# Patient Record
Sex: Female | Born: 1935 | Race: Black or African American | Hispanic: No | Smoking: Never smoker
Health system: Southern US, Community
[De-identification: ages and names within clinical notes are randomized; demographics above are authoritative.]

## PROBLEM LIST (undated history)

## (undated) DIAGNOSIS — G629 Polyneuropathy, unspecified: Secondary | ICD-10-CM

## (undated) DIAGNOSIS — M1990 Unspecified osteoarthritis, unspecified site: Secondary | ICD-10-CM

## (undated) DIAGNOSIS — E785 Hyperlipidemia, unspecified: Secondary | ICD-10-CM

## (undated) DIAGNOSIS — D649 Anemia, unspecified: Secondary | ICD-10-CM

## (undated) DIAGNOSIS — Z86711 Personal history of pulmonary embolism: Secondary | ICD-10-CM

## (undated) DIAGNOSIS — C8300 Small cell B-cell lymphoma, unspecified site: Secondary | ICD-10-CM

## (undated) DIAGNOSIS — F329 Major depressive disorder, single episode, unspecified: Secondary | ICD-10-CM

## (undated) DIAGNOSIS — F0390 Unspecified dementia without behavioral disturbance: Secondary | ICD-10-CM

## (undated) DIAGNOSIS — C801 Malignant (primary) neoplasm, unspecified: Secondary | ICD-10-CM

## (undated) HISTORY — PX: OTHER SURGICAL HISTORY: SHX169

## (undated) HISTORY — PX: COLON SURGERY: SHX602

## (undated) HISTORY — PX: ABDOMINAL HYSTERECTOMY: SHX81

## (undated) HISTORY — PX: APPENDECTOMY: SHX54

---

## 2014-12-20 DIAGNOSIS — Z933 Colostomy status: Secondary | ICD-10-CM

## 2014-12-20 DIAGNOSIS — C9110 Chronic lymphocytic leukemia of B-cell type not having achieved remission: Secondary | ICD-10-CM | POA: Diagnosis present

## 2019-04-02 DIAGNOSIS — R509 Fever, unspecified: Secondary | ICD-10-CM | POA: Diagnosis present

## 2019-04-20 DIAGNOSIS — Q6211 Congenital occlusion of ureteropelvic junction: Secondary | ICD-10-CM

## 2019-05-27 ENCOUNTER — Inpatient Hospital Stay (HOSPITAL_COMMUNITY)
Admission: EM | Admit: 2019-05-27 | Discharge: 2019-06-03 | DRG: 871 | Disposition: A | Discharge status: Hospice | Payer: Medicare Other | Source: Skilled Nursing Facility | Attending: Internal Medicine | Admitting: Internal Medicine

## 2019-05-27 ENCOUNTER — Emergency Department (HOSPITAL_COMMUNITY): Payer: Medicare Other

## 2019-05-27 ENCOUNTER — Encounter (HOSPITAL_COMMUNITY): Payer: Self-pay

## 2019-05-27 DIAGNOSIS — Z86711 Personal history of pulmonary embolism: Secondary | ICD-10-CM | POA: Diagnosis not present

## 2019-05-27 DIAGNOSIS — Z933 Colostomy status: Secondary | ICD-10-CM

## 2019-05-27 DIAGNOSIS — Z87891 Personal history of nicotine dependence: Secondary | ICD-10-CM | POA: Diagnosis not present

## 2019-05-27 DIAGNOSIS — C9110 Chronic lymphocytic leukemia of B-cell type not having achieved remission: Secondary | ICD-10-CM | POA: Diagnosis present

## 2019-05-27 DIAGNOSIS — Z881 Allergy status to other antibiotic agents status: Secondary | ICD-10-CM | POA: Diagnosis not present

## 2019-05-27 DIAGNOSIS — Q6211 Congenital occlusion of ureteropelvic junction: Secondary | ICD-10-CM | POA: Diagnosis not present

## 2019-05-27 DIAGNOSIS — R0602 Shortness of breath: Secondary | ICD-10-CM

## 2019-05-27 DIAGNOSIS — Z7189 Other specified counseling: Secondary | ICD-10-CM

## 2019-05-27 DIAGNOSIS — Z66 Do not resuscitate: Secondary | ICD-10-CM | POA: Diagnosis present

## 2019-05-27 DIAGNOSIS — D6959 Other secondary thrombocytopenia: Secondary | ICD-10-CM | POA: Diagnosis present

## 2019-05-27 DIAGNOSIS — E785 Hyperlipidemia, unspecified: Secondary | ICD-10-CM | POA: Diagnosis present

## 2019-05-27 DIAGNOSIS — F0390 Unspecified dementia without behavioral disturbance: Secondary | ICD-10-CM | POA: Diagnosis present

## 2019-05-27 DIAGNOSIS — N390 Urinary tract infection, site not specified: Secondary | ICD-10-CM | POA: Diagnosis present

## 2019-05-27 DIAGNOSIS — N136 Pyonephrosis: Secondary | ICD-10-CM | POA: Diagnosis present

## 2019-05-27 DIAGNOSIS — L8990 Pressure ulcer of unspecified site, unspecified stage: Secondary | ICD-10-CM | POA: Diagnosis present

## 2019-05-27 DIAGNOSIS — M7989 Other specified soft tissue disorders: Secondary | ICD-10-CM | POA: Diagnosis not present

## 2019-05-27 DIAGNOSIS — Z515 Encounter for palliative care: Secondary | ICD-10-CM

## 2019-05-27 DIAGNOSIS — Z681 Body mass index (BMI) 19 or less, adult: Secondary | ICD-10-CM | POA: Diagnosis not present

## 2019-05-27 DIAGNOSIS — D539 Nutritional anemia, unspecified: Secondary | ICD-10-CM | POA: Diagnosis present

## 2019-05-27 DIAGNOSIS — Z85038 Personal history of other malignant neoplasm of large intestine: Secondary | ICD-10-CM

## 2019-05-27 DIAGNOSIS — D509 Iron deficiency anemia, unspecified: Secondary | ICD-10-CM | POA: Diagnosis present

## 2019-05-27 DIAGNOSIS — J90 Pleural effusion, not elsewhere classified: Secondary | ICD-10-CM

## 2019-05-27 DIAGNOSIS — Z20828 Contact with and (suspected) exposure to other viral communicable diseases: Secondary | ICD-10-CM | POA: Diagnosis present

## 2019-05-27 DIAGNOSIS — A419 Sepsis, unspecified organism: Secondary | ICD-10-CM | POA: Diagnosis not present

## 2019-05-27 DIAGNOSIS — A4159 Other Gram-negative sepsis: Principal | ICD-10-CM | POA: Diagnosis present

## 2019-05-27 DIAGNOSIS — T83022A Displacement of nephrostomy catheter, initial encounter: Secondary | ICD-10-CM

## 2019-05-27 DIAGNOSIS — R509 Fever, unspecified: Secondary | ICD-10-CM | POA: Diagnosis present

## 2019-05-27 DIAGNOSIS — G629 Polyneuropathy, unspecified: Secondary | ICD-10-CM | POA: Diagnosis present

## 2019-05-27 DIAGNOSIS — Z9889 Other specified postprocedural states: Secondary | ICD-10-CM

## 2019-05-27 DIAGNOSIS — F329 Major depressive disorder, single episode, unspecified: Secondary | ICD-10-CM | POA: Diagnosis present

## 2019-05-27 DIAGNOSIS — E43 Unspecified severe protein-calorie malnutrition: Secondary | ICD-10-CM | POA: Insufficient documentation

## 2019-05-27 DIAGNOSIS — G9341 Metabolic encephalopathy: Secondary | ICD-10-CM | POA: Diagnosis present

## 2019-05-27 HISTORY — DX: Major depressive disorder, single episode, unspecified: F32.9

## 2019-05-27 HISTORY — DX: Unspecified osteoarthritis, unspecified site: M19.90

## 2019-05-27 HISTORY — DX: Malignant (primary) neoplasm, unspecified: C80.1

## 2019-05-27 HISTORY — DX: Personal history of pulmonary embolism: Z86.711

## 2019-05-27 HISTORY — DX: Polyneuropathy, unspecified: G62.9

## 2019-05-27 HISTORY — DX: Small cell B-cell lymphoma, unspecified site: C83.00

## 2019-05-27 HISTORY — DX: Anemia, unspecified: D64.9

## 2019-05-27 HISTORY — DX: Unspecified dementia, unspecified severity, without behavioral disturbance, psychotic disturbance, mood disturbance, and anxiety: F03.90

## 2019-05-27 HISTORY — DX: Hyperlipidemia, unspecified: E78.5

## 2019-05-27 LAB — URINALYSIS, ROUTINE W REFLEX MICROSCOPIC
Bilirubin Urine: NEGATIVE
Glucose, UA: NEGATIVE mg/dL
Ketones, ur: NEGATIVE mg/dL
Nitrite: NEGATIVE
Protein, ur: 30 mg/dL — AB
RBC / HPF: >50 RBC/hpf — ABNORMAL HIGH (ref 0–5)
Specific Gravity, Urine: 1.015 (ref 1.005–1.030)
WBC, UA: >50 WBC/hpf — ABNORMAL HIGH (ref 0–5)
pH: 5 (ref 5.0–8.0)

## 2019-05-27 LAB — CBC WITH DIFFERENTIAL/PLATELET
Abs Immature Granulocytes: 1.06 10*3/uL — ABNORMAL HIGH (ref 0.00–0.07)
Basophils Absolute: 0.2 10*3/uL — ABNORMAL HIGH (ref 0.0–0.1)
Basophils Relative: 0 %
Eosinophils Absolute: 0.4 10*3/uL (ref 0.0–0.5)
Eosinophils Relative: 0 %
HCT: 31.5 % — ABNORMAL LOW (ref 36.0–46.0)
Hemoglobin: 9 g/dL — ABNORMAL LOW (ref 12.0–15.0)
Immature Granulocytes: 1 %
Lymphocytes Relative: 92 %
Lymphs Abs: 168.5 10*3/uL — ABNORMAL HIGH (ref 0.7–4.0)
MCH: 30.7 pg (ref 26.0–34.0)
MCHC: 28.6 g/dL — ABNORMAL LOW (ref 30.0–36.0)
MCV: 107.5 fL — ABNORMAL HIGH (ref 80.0–100.0)
Monocytes Absolute: 11.6 10*3/uL — ABNORMAL HIGH (ref 0.1–1.0)
Monocytes Relative: 6 %
Neutro Abs: 2.5 10*3/uL (ref 1.7–7.7)
Neutrophils Relative %: 1 %
Platelets: 26 10*3/uL — CL (ref 150–400)
RBC: 2.93 MIL/uL — ABNORMAL LOW (ref 3.87–5.11)
RDW: 22.7 % — ABNORMAL HIGH (ref 11.5–15.5)
WBC: 184.2 10*3/uL (ref 4.0–10.5)
nRBC: 0.2 % (ref 0.0–0.2)

## 2019-05-27 LAB — COMPREHENSIVE METABOLIC PANEL
ALT: 23 U/L (ref 0–44)
AST: 46 U/L — ABNORMAL HIGH (ref 15–41)
Albumin: 3.2 g/dL — ABNORMAL LOW (ref 3.5–5.0)
Alkaline Phosphatase: 84 U/L (ref 38–126)
Anion gap: 12 (ref 5–15)
BUN: 40 mg/dL — ABNORMAL HIGH (ref 8–23)
CO2: 23 mmol/L (ref 22–32)
Calcium: 9.2 mg/dL (ref 8.9–10.3)
Chloride: 110 mmol/L (ref 98–111)
Creatinine, Ser: 1.27 mg/dL — ABNORMAL HIGH (ref 0.44–1.00)
GFR calc Af Amer: 45 mL/min — ABNORMAL LOW (ref >60)
GFR calc non Af Amer: 38 mL/min — ABNORMAL LOW (ref >60)
Glucose, Bld: 135 mg/dL — ABNORMAL HIGH (ref 70–99)
Potassium: 5.1 mmol/L (ref 3.5–5.1)
Sodium: 145 mmol/L (ref 135–145)
Total Bilirubin: 0.6 mg/dL (ref 0.3–1.2)
Total Protein: 6.4 g/dL — ABNORMAL LOW (ref 6.5–8.1)

## 2019-05-27 LAB — BLOOD GAS, VENOUS
Acid-Base Excess: 1 mmol/L (ref 0.0–2.0)
Bicarbonate: 25.2 mmol/L (ref 20.0–28.0)
O2 Saturation: 81.8 %
Patient temperature: 98.6
pCO2, Ven: 40.3 mmHg — ABNORMAL LOW (ref 44.0–60.0)
pH, Ven: 7.412 (ref 7.250–7.430)
pO2, Ven: 50.3 mmHg — ABNORMAL HIGH (ref 32.0–45.0)

## 2019-05-27 LAB — LACTIC ACID, PLASMA: Lactic Acid, Venous: 1 mmol/L (ref 0.5–1.9)

## 2019-05-27 LAB — POC OCCULT BLOOD, ED: Fecal Occult Bld: NEGATIVE

## 2019-05-27 LAB — PATHOLOGIST SMEAR REVIEW

## 2019-05-27 LAB — SARS CORONAVIRUS 2 (TAT 6-24 HRS): SARS Coronavirus 2: NEGATIVE

## 2019-05-27 MED ORDER — TRAZODONE HCL 50 MG PO TABS
50.0000 mg | ORAL_TABLET | Freq: Every day | ORAL | Status: DC
  Administered 2019-05-29 – 2019-06-01 (×4): 50 mg via ORAL
  Filled 2019-05-27 (×4): qty 1

## 2019-05-27 MED ORDER — POLYETHYLENE GLYCOL 3350 17 G PO PACK: 17.0000 g | PACK | Freq: Every day | ORAL | Status: DC | PRN

## 2019-05-27 MED ORDER — INTEGRA F 125-1 MG PO CAPS: 1.0000 | ORAL_CAPSULE | Freq: Every day | ORAL | Status: DC

## 2019-05-27 MED ORDER — ONDANSETRON HCL 4 MG PO TABS: 4.0000 mg | ORAL_TABLET | Freq: Two times a day (BID) | ORAL | Status: DC | PRN

## 2019-05-27 MED ORDER — FERROUS SULFATE 325 (65 FE) MG PO TABS
325.0000 mg | ORAL_TABLET | Freq: Two times a day (BID) | ORAL | Status: DC
  Administered 2019-05-28 – 2019-05-31 (×7): 325 mg via ORAL
  Filled 2019-05-27 (×8): qty 1

## 2019-05-27 MED ORDER — ONDANSETRON HCL 4 MG PO TABS: 4.0000 mg | ORAL_TABLET | Freq: Four times a day (QID) | ORAL | Status: DC | PRN

## 2019-05-27 MED ORDER — FLUTICASONE FUROATE 100 MCG/ACT IN AEPB: 1.0000 | INHALATION_SPRAY | Freq: Every day | RESPIRATORY_TRACT | Status: DC

## 2019-05-27 MED ORDER — POTASSIUM CHLORIDE CRYS ER 10 MEQ PO TBCR: 10.0000 meq | EXTENDED_RELEASE_TABLET | Freq: Two times a day (BID) | ORAL | Status: DC

## 2019-05-27 MED ORDER — SODIUM CHLORIDE 0.9 % IV SOLN
2.0000 g | INTRAVENOUS | Status: AC
  Administered 2019-05-27: 12:00:00 2 g via INTRAVENOUS
  Filled 2019-05-27: qty 2

## 2019-05-27 MED ORDER — SODIUM CHLORIDE 0.9 % IV SOLN
2.0000 g | INTRAVENOUS | Status: DC
  Administered 2019-05-28 – 2019-05-30 (×3): 2 g via INTRAVENOUS
  Filled 2019-05-27 (×3): qty 2

## 2019-05-27 MED ORDER — ALLOPURINOL 300 MG PO TABS: 300.0000 mg | ORAL_TABLET | Freq: Every day | ORAL | Status: DC

## 2019-05-27 MED ORDER — VALACYCLOVIR HCL 500 MG PO TABS
500.0000 mg | ORAL_TABLET | Freq: Every day | ORAL | Status: DC
  Administered 2019-05-29 – 2019-05-31 (×3): 500 mg via ORAL
  Filled 2019-05-27 (×4): qty 1

## 2019-05-27 MED ORDER — VANCOMYCIN HCL IN DEXTROSE 1-5 GM/200ML-% IV SOLN
1000.0000 mg | INTRAVENOUS | Status: AC
  Administered 2019-05-27: 1000 mg via INTRAVENOUS
  Filled 2019-05-27: qty 200

## 2019-05-27 MED ORDER — ACETAMINOPHEN 650 MG RE SUPP
650.0000 mg | Freq: Four times a day (QID) | RECTAL | Status: DC | PRN
  Administered 2019-05-27 – 2019-06-03 (×2): 650 mg via RECTAL
  Filled 2019-05-27 (×2): qty 1

## 2019-05-27 MED ORDER — LACTATED RINGERS IV BOLUS (SEPSIS)
250.0000 mL | Freq: Once | INTRAVENOUS | Status: AC
  Administered 2019-05-27: 250 mL via INTRAVENOUS

## 2019-05-27 MED ORDER — ACETAMINOPHEN 500 MG PO TABS: 1000.0000 mg | ORAL_TABLET | Freq: Three times a day (TID) | ORAL | Status: DC | PRN

## 2019-05-27 MED ORDER — ACETAMINOPHEN 650 MG RE SUPP
975.0000 mg | Freq: Once | RECTAL | Status: AC
  Administered 2019-05-27: 975 mg via RECTAL
  Filled 2019-05-27: qty 1

## 2019-05-27 MED ORDER — ONDANSETRON HCL 4 MG/2ML IJ SOLN: 4.0000 mg | Freq: Four times a day (QID) | INTRAMUSCULAR | Status: DC | PRN

## 2019-05-27 MED ORDER — MAGNESIUM OXIDE 400 (241.3 MG) MG PO TABS
400.0000 mg | ORAL_TABLET | Freq: Every day | ORAL | Status: DC
  Administered 2019-05-29 – 2019-05-31 (×3): 400 mg via ORAL
  Filled 2019-05-27 (×4): qty 1

## 2019-05-27 MED ORDER — BUDESONIDE 0.25 MG/2ML IN SUSP
0.2500 mg | Freq: Two times a day (BID) | RESPIRATORY_TRACT | Status: DC
  Administered 2019-05-28 – 2019-06-03 (×13): 0.25 mg via RESPIRATORY_TRACT
  Filled 2019-05-27 (×14): qty 2

## 2019-05-27 MED ORDER — ALLOPURINOL 100 MG PO TABS: 100.0000 mg | ORAL_TABLET | Freq: Every day | ORAL | Status: DC

## 2019-05-27 MED ORDER — LACTATED RINGERS IV BOLUS (SEPSIS)
1000.0000 mL | Freq: Once | INTRAVENOUS | Status: AC
  Administered 2019-05-27: 12:00:00 1000 mL via INTRAVENOUS

## 2019-05-27 MED ORDER — VANCOMYCIN HCL 500 MG IV SOLR
500.0000 mg | INTRAVENOUS | Status: DC
  Administered 2019-05-29: 500 mg via INTRAVENOUS
  Filled 2019-05-27 (×2): qty 500

## 2019-05-27 MED ORDER — SODIUM CHLORIDE 0.9 % IV SOLN
INTRAVENOUS | Status: DC
  Administered 2019-05-27 – 2019-05-28 (×2): via INTRAVENOUS

## 2019-05-27 MED ORDER — ATORVASTATIN CALCIUM 10 MG PO TABS
10.0000 mg | ORAL_TABLET | Freq: Every day | ORAL | Status: DC
  Filled 2019-05-27: qty 1

## 2019-05-27 MED ORDER — VITAMIN D3 25 MCG (1000 UNIT) PO TABS
2000.0000 [IU] | ORAL_TABLET | Freq: Every day | ORAL | Status: DC
  Administered 2019-05-29 – 2019-05-31 (×3): 2000 [IU] via ORAL
  Filled 2019-05-27 (×4): qty 2

## 2019-05-27 MED ORDER — METRONIDAZOLE IN NACL 5-0.79 MG/ML-% IV SOLN
500.0000 mg | INTRAVENOUS | Status: AC
  Administered 2019-05-27: 500 mg via INTRAVENOUS
  Filled 2019-05-27: qty 100

## 2019-05-27 MED ORDER — ADULT MULTIVITAMIN W/MINERALS CH
1.0000 | ORAL_TABLET | Freq: Every day | ORAL | Status: DC
  Administered 2019-05-29 – 2019-05-31 (×3): 1 via ORAL
  Filled 2019-05-27 (×4): qty 1

## 2019-05-27 MED ORDER — ACETAMINOPHEN 325 MG PO TABS: 650.0000 mg | ORAL_TABLET | Freq: Four times a day (QID) | ORAL | Status: DC | PRN

## 2019-05-27 NOTE — ED Provider Notes (Signed)
[facility] DEPT Provider Note   CSN: [id number] Arrival date & time: 05/27/19  1017     History   Chief Complaint No chief complaint on file.   HPI Samantha Mata is a 85 y.o. female.     HPI   She presents for evaluation of general weakness.  She lives in a nursing care facility.  She is DNR.  She is unable to give any history.  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   History reviewed. No pertinent surgical history.   OB History   No obstetric history on file.      Home Medications    Prior to Admission medications   Not on File    Family History History reviewed. No pertinent family history.  Social History Social History   Tobacco Use   Smoking status: Not on file  Substance Use Topics   Alcohol use: Not on file   Drug use: Not on file     Allergies   Neosporin [bacitracin-polymyxin b]   Review of Systems Review of Systems  Unable to perform ROS: Dementia     Physical Exam Updated Vital Signs BP (!) 104/57    Pulse 98    Temp (!) 101 F (38.3 C) (Rectal)    Resp 19    Wt 40.8 kg    SpO2 92%   Physical Exam Vitals signs and nursing note reviewed.  Constitutional:      General: She is in acute distress.     Appearance: She is well-developed. She is ill-appearing. She is not toxic-appearing or diaphoretic.  HENT:     Head: Normocephalic and atraumatic.     Right Ear: External ear normal.     Left Ear: External ear normal.     Mouth/Throat:     Pharynx: No oropharyngeal exudate or posterior oropharyngeal erythema.  Eyes:     Conjunctiva/sclera: Conjunctivae normal.     Pupils: Pupils are equal, round, and reactive to light.  Neck:     Musculoskeletal: Normal range of motion  and neck supple.     Trachea: Phonation normal.  Cardiovascular:     Rate and Rhythm: Normal rate and regular rhythm.     Heart sounds: Normal heart sounds.  Pulmonary:     Effort: Pulmonary effort is normal.     Breath sounds: Normal breath sounds.  Abdominal:     General: There is no distension.     Palpations: Abdomen is soft.     Tenderness: There is no abdominal tenderness.     Comments: Ostomy left abdomen, with intact ostomy bag, and black stool in bag.  Genitourinary:    Comments: Bilateral nephrostomy tubes are present, sites and appliances appear normal.  Yellow urine is in each bag. Musculoskeletal: Normal range of motion.  Skin:    General: Skin is warm and dry.     Comments: No skin lesions of the perineum, anterior or posterior.  There is mild tenderness over the sacral region with a suggestion of very early skin breakdown at the site.  This does not meet criteria for pressure sore, stage I.  Neurological:     Mental Status: She is alert.     Cranial  Nerves: No cranial nerve deficit.     Motor: No weakness or abnormal muscle tone.     Comments: She is alert and conversant.  No aphasia.  She is confused.  She follows commands.  Psychiatric:        Mood and Affect: Mood normal.        Behavior: Behavior normal.      ED Treatments / Results  Labs (all labs ordered are listed, but only abnormal results are displayed) Labs Reviewed  COMPREHENSIVE METABOLIC PANEL - Abnormal; Notable for the following components:      Result Value   Glucose, Bld 135 (*)    BUN 40 (*)    Creatinine, Ser 1.27 (*)    Total Protein 6.4 (*)    Albumin 3.2 (*)    AST 46 (*)    GFR calc non Af Amer 38 (*)    GFR calc Af Amer 45 (*)    All other components within normal limits  CBC WITH DIFFERENTIAL/PLATELET - Abnormal; Notable for the following components:   WBC 184.2 (*)    RBC 2.93 (*)    Hemoglobin 9.0 (*)    HCT 31.5 (*)    MCV 107.5 (*)    MCHC 28.6 (*)    RDW 22.7 (*)     Platelets 26 (*)    All other components within normal limits  URINALYSIS, ROUTINE W REFLEX MICROSCOPIC - Abnormal; Notable for the following components:   Color, Urine YELLOW (*)    APPearance CLOUDY (*)    Hgb urine dipstick LARGE (*)    Protein, ur 30 (*)    Leukocytes,Ua LARGE (*)    RBC / HPF >50 (*)    WBC, UA >50 (*)    Bacteria, UA FEW (*)    All other components within normal limits  BLOOD GAS, VENOUS - Abnormal; Notable for the following components:   pCO2, Ven 40.3 (*)    pO2, Ven 50.3 (*)    All other components within normal limits  CULTURE, BLOOD (ROUTINE X 2)  CULTURE, BLOOD (ROUTINE X 2)  URINE CULTURE  SARS CORONAVIRUS 2 (TAT 6-24 HRS)  LACTIC ACID, PLASMA  PATHOLOGIST SMEAR REVIEW  POC OCCULT BLOOD, ED    EKG EKG Interpretation  Date/Time:  Wednesday May 27 2019 10:39:29 EST Ventricular Rate:  106 PR Interval:    QRS Duration: 94 QT Interval:  337 QTC Calculation: 448 R Axis:   -111 Text Interpretation: Sinus tachycardia Left anterior fascicular block No old tracing to compare Confirmed by Daleen Bo [phone]) on 05/27/2019 3:16:57 PM   Radiology Dg Chest Port 1 View  Result Date: 05/27/2019 CLINICAL DATA:  Altered mental status. History of chronic lymphocytic leukemia. EXAM: PORTABLE CHEST 1 VIEW COMPARISON:  April 10, 2019. FINDINGS: Port-A-Cath tip is in the superior vena cava. No pneumothorax. There are pleural effusions bilaterally. There is no appreciable edema or consolidation. Heart size and pulmonary vascularity normal. No adenopathy. There is aortic atherosclerosis. Bones are osteoporotic.  No focal bone lesions are evident. IMPRESSION: Pleural effusions bilaterally without demonstrable edema or consolidation. Heart size normal. No adenopathy demonstrable by radiography. Port-A-Cath tip in superior vena cava. Bones osteoporotic. Aortic Atherosclerosis (ICD10-I70.0). Electronically Signed   By: Lowella Grip III M.D.   On: 05/27/2019  11:15    Procedures .Critical Care Performed by: Daleen Bo, MD Authorized by: Daleen Bo, MD   Critical care provider statement:    Critical care time (minutes):  35   Critical care  start time:  05/27/2019 10:40 AM   Critical care end time:  05/27/2019 4:24 PM   Critical care time was exclusive of:  Separately billable procedures and treating other patients   Critical care was necessary to treat or prevent imminent or life-threatening deterioration of the following conditions:  Sepsis   Critical care was time spent personally by me on the following activities:  Blood draw for specimens, development of treatment plan with patient or surrogate, discussions with consultants, evaluation of patient's response to treatment, examination of patient, obtaining history from patient or surrogate, ordering and performing treatments and interventions, ordering and review of laboratory studies, pulse oximetry, re-evaluation of patient's condition, review of old charts and ordering and review of radiographic studies   (including critical care time)  Medications Ordered in ED Medications  lactated ringers bolus 1,000 mL (0 mLs Intravenous Stopped 05/27/19 1215)    And  lactated ringers bolus 250 mL (0 mLs Intravenous Stopped 05/27/19 1215)  acetaminophen (TYLENOL) suppository 975 mg (975 mg Rectal Given 05/27/19 1211)  vancomycin (VANCOCIN) IVPB 1000 mg/200 mL premix (1,000 mg Intravenous New Bag/Given 05/27/19 1514)  ceFEPIme (MAXIPIME) 2 g in sodium chloride 0.9 % 100 mL IVPB (0 g Intravenous Stopped 05/27/19 1350)  metroNIDAZOLE (FLAGYL) IVPB 500 mg (0 mg Intravenous Stopped 05/27/19 1515)     Initial Impression / Assessment and Plan / ED Course  I have reviewed the triage vital signs and the nursing notes.  Pertinent labs & imaging results that were available during my care of the patient were reviewed by me and considered in my medical decision making (see chart for  details).  Clinical Course as of May 26 1624  Wed May 27, 2019  1414 WBC(!!): 184.2 [EW]  1505 Normal  POC occult blood, ED RN will collect [EW]  1505 Normal  Lactic acid, plasma [EW]  1505 Abnormal, presence of hemoglobin, protein, leukocytes, RBC, WBC, bacteria.  Urine culture ordered  Urinalysis, Routine w reflex microscopic (not at [facility])(!) [EW]  1505 Normal except glucose high, BUN high, creatinine high, total protein low, albumin low, AST high, GFR low  Comprehensive metabolic panel(!) [EW]  99991111 Abnormal, elevated white count, low hemoglobin, elevated MCV, low platelets  CBC WITH DIFFERENTIAL(!!) [EW]  1517 Bilateral pleural effusions, without evidence for congestive heart failure or infiltrates.  Image interpreted by me  DG Chest [facility] [EW]    Clinical Course User Index [EW] Daleen Bo, MD        Patient Vitals for the past 24 hrs:  BP Temp Temp src Pulse Resp SpO2 Weight  05/27/19 1559 -- (!) 101 F (38.3 C) Rectal -- -- -- --  05/27/19 1545 (!) 104/57 -- -- 98 19 92 % --  05/27/19 1515 (!) 103/54 -- -- (!) 103 19 93 % --  05/27/19 1500 (!) 93/54 -- -- (!) 101 18 93 % --  05/27/19 1445 (!) 91/58 -- -- (!) 103 17 94 % --  05/27/19 1430 (!) 101/53 -- -- (!) 102 19 93 % --  05/27/19 1415 (!) 106/51 -- -- (!) 105 18 93 % --  05/27/19 1400 (!) 104/54 -- -- (!) 107 18 93 % --  05/27/19 1345 (!) 101/56 -- -- 99 17 92 % --  05/27/19 1330 (!) 89/61 -- -- (!) 108 17 92 % --  05/27/19 1315 (!) 88/51 -- -- (!) 102 17 94 % --  05/27/19 1301 (!) 95/57 -- -- (!) 101 16 94 % --  05/27/19  1300 (!) 86/55 -- -- 100 17 95 % --  05/27/19 1245 (!) 102/57 -- -- 99 16 96 % --  05/27/19 1230 (!) 111/54 -- -- 96 17 96 % --  05/27/19 1215 (!) 114/59 -- -- 96 17 96 % --  05/27/19 1200 (!) 110/58 -- -- 96 17 95 % --  05/27/19 1145 114/61 -- -- 97 -- 94 % --  05/27/19 1130 116/64 -- -- (!) 104 (!) 22 94 % --  05/27/19 1115 113/71 -- -- (!) 112 (!) 23 94 % --  05/27/19 1100 111/68  -- -- (!) 107 (!) 23 96 % --  05/27/19 1045 113/64 -- -- (!) 104 (!) 23 95 % --  05/27/19 1042 -- -- -- -- -- -- 40.8 kg  05/27/19 1035 112/69 (!) 101.1 F (38.4 C) Rectal (!) 113 (!) 24 96 % --    3:06 PM Reevaluation with update and discussion. After initial assessment and treatment, an updated evaluation reveals no change in status. Daleen Bo   Medical Decision Making: Patient presenting with febrile illness, he has history of CLL and lymphoma, and has been receiving ongoing chemotherapy, apparently for newly discovered pelvic mass.  Recently she had thrombocytopenia, both treated with platelets).  She has received blood transfusions for chemotherapy associated anemia.  Recent WBC count on 05/15/2019, markedly elevated at 264,000.  Also at that time, hemoglobin was 8.3, it is better today.  White count is chronically elevated.  She receives her hematologic care in [place], [place].  She is SIRS positive with normal initial lactate.  Doubt severe sepsis.  She will require management in the observed setting, the hospital.  CRITICAL CARE- yes Performed by: Daleen Bo  Nursing Notes Reviewed/ Care Coordinated Applicable Imaging Reviewed Interpretation of Laboratory Data incorporated into ED treatment  4:01 PM-Consult complete with hospitalist. Patient case explained and discussed.  He agrees to admit patient for further evaluation and treatment. Call ended at 4:24 PM  Plan: Admit  Final Clinical Impressions(s) / ED Diagnoses   Final diagnoses:  Urinary tract infection without hematuria, site unspecified    ED Discharge Orders    None       Daleen Bo, MD 05/27/19 1631

## 2019-05-27 NOTE — ED Triage Notes (Addendum)
Per EMS: Pt hx of metastatic cancer.  Pt has had increased weakness the past week, and has been altered since this am.  Pt has colostomy and nephrostomy tubes.  Pt normally A&Ox2.  Pt A&Ox0 at this time.  122/74 HR 110 RR 28 ETCO2 20-25 Temp 99.7 CBG 157 20 gauge LFA. 500 mL NS given enroute

## 2019-05-27 NOTE — H&P (Signed)
[facility] History and Physical  Samantha Mata [id number] DOB: Dec 30, 1931 DOA: 05/27/2019  Referring physician: ED  PCP: Patient, No Pcp Per   History is limited due to patient's condition and unable to reach family  Chief Complaint: Fever and confusion  HPI: Samantha Mata is a 85 y.o. female with history of small B-cell lymphoma, hyperlipidemia, depression, dementia, was brought in hospital for evaluation of generalized weakness.  Patient is from a skilled nursing facility and was unable to give any history.  Patient was unable to provide any meaningful history and I was unable to reach the patient's son.  Spoke with the ED provider and reviewed the medical records.  Patient is currently getting [facility] for [facility] at [facility].  She does have a nephrostomy bilaterally for pelvic mass and colostomy as well.  ED Course: In the ED, was noted to be febrile with a temperature of 101 F, tachycardiac respiratory rate >20, initial blood pressure was 86/55 which improved with IV fluids.  Patient received 1450 mL of IV fluid.  Patient also received cefepime, metronidazole and vancomycin in the ED. dark stool was noted from her colostomy site.   Review of Systems:  Limited due to patient's mental status.  Past Medical History:  Diagnosis Date  . Anemia   . Arthritis   . Cancer ([place])   . Dementia ([place])   . Hx of pulmonary embolus   . Hyperlipidemia   . Major depressive disorder   . Peripheral neuropathy   . Small cell B-cell lymphoma [facility])    Past Surgical History:  Procedure Laterality Date  . ABDOMINAL HYSTERECTOMY    . APPENDECTOMY    . Bilateral Nephrostomy Tube Placement    . COLON SURGERY  1960's   colostomy    Social History:  reports that she has never smoked. She has quit using smokeless tobacco.  Her smokeless tobacco use included snuff. She reports that she does not drink alcohol or use drugs.  Allergies  Allergen Reactions  . Neosporin  [Bacitracin-Polymyxin B]     History reviewed. No pertinent family history.   Prior to Admission medications   Medication Sig Start Date End Date Taking? Authorizing Provider  acetaminophen (TYLENOL) 500 MG tablet Take 1,000 mg by mouth every 8 (eight) hours as needed for moderate pain.   Yes [provider]  albuterol (VENTOLIN HFA) 108 (90 Base) MCG/ACT inhaler Inhale 2 puffs into the lungs every 6 (six) hours as needed for wheezing or shortness of breath.   Yes [provider]  allopurinol (ZYLOPRIM) 300 MG tablet Take 300 mg by mouth daily.   Yes [provider]  atorvastatin (LIPITOR) 10 MG tablet Take 10 mg by mouth at bedtime.   Yes [provider]  cholecalciferol (VITAMIN D) 25 MCG (1000 UT) tablet Take 2,000 Units by mouth daily.   Yes [provider]  Fe Fum-FePoly-FA-Vit C-Vit B3 (INTEGRA F) 125-1 MG CAPS Take 1 tablet by mouth daily.   Yes [provider]  Fluticasone Furoate (ARNUITY ELLIPTA) 100 MCG/ACT AEPB Inhale 1 puff into the lungs daily.   Yes [provider]  gabapentin (NEURONTIN) 300 MG capsule Take 300 mg by mouth 2 (two) times daily.   Yes [provider]  magnesium oxide (MAG-OX) 400 MG tablet Take 400 mg by mouth daily.   Yes [provider]  Multiple Vitamin (MULTIVITAMIN) tablet Take 1 tablet by mouth daily.   Yes [provider]  ondansetron (ZOFRAN) 4 MG tablet  Take 4 mg by mouth 2 (two) times daily as needed for nausea or vomiting.   Yes [provider]  potassium chloride (KLOR-CON) 10 MEQ tablet Take 10 mEq by mouth 2 (two) times daily.   Yes [provider]  sertraline (ZOLOFT) 100 MG tablet Take 100 mg by mouth daily.   Yes [provider]  traZODone (DESYREL) 50 MG tablet Take 50 mg by mouth at bedtime.   Yes [provider]  valACYclovir (VALTREX) 500 MG tablet Take 500 mg by mouth daily.   Yes [provider]    Physical  Exam: Vitals:   05/27/19 1500 05/27/19 1515 05/27/19 1545 05/27/19 1559  BP: (!) 93/54 (!) 103/54 (!) 104/57   Pulse: (!) 101 (!) 103 98   Resp: 18 19 19    Temp:    (!) 101 F (38.3 C)  TempSrc:    Rectal  SpO2: 93% 93% 92%   Weight:       Wt Readings from Last 3 Encounters:  05/27/19 40.8 kg   There is no height or weight on file to calculate BMI.  General: Thinly built nasal cannula and facemask in place.  Unable to present speech. HENT: Normocephalic, pupils equally reacting to light and accommodation.  No scleral pallor or icterus noted. Oral mucosa is dry Chest:  Clear breath sounds.  Diminished breath sounds bilaterally. No crackles or wheezes.  Left-sided chest wall Port-A-Cath in place. CVS: S1 &S2 heard. No murmur.  Regular rate and rhythm. Abdomen: Soft, nontender, nondistended.  Bowel sounds are heard.  Abdominal scar noted.  Bilateral percutaneous nephrostomy in place.  Colostomy bag in place.   Extremities: No cyanosis, clubbing or edema.  Peripheral pulses are palpable. Psych: Normal meaningful conversation.  Advanced dementia. CNS: Moving all extremities. Skin: Warm and dry.  No rashes noted.  Labs on Admission:   CBC: Recent Labs  Lab 05/27/19 1109  WBC 184.2*  NEUTROABS PENDING  HGB 9.0*  HCT 31.5*  MCV 107.5*  PLT 26*    Basic Metabolic Panel: Recent Labs  Lab 05/27/19 1109  NA 145  K 5.1  CL 110  CO2 23  GLUCOSE 135*  BUN 40*  CREATININE 1.27*  CALCIUM 9.2    Liver Function Tests: Recent Labs  Lab 05/27/19 1109  AST 46*  ALT 23  ALKPHOS 84  BILITOT 0.6  PROT 6.4*  ALBUMIN 3.2*   No results for input(s): LIPASE, AMYLASE in the last 168 hours. No results for input(s): AMMONIA in the last 168 hours.  Cardiac Enzymes: No results for input(s): CKTOTAL, CKMB, CKMBINDEX, TROPONINI in the last 168 hours.  BNP (last 3 results) No results for input(s): BNP in the last 8760 hours.  ProBNP (last 3 results) No results for input(s):  PROBNP in the last 8760 hours.  CBG: No results for input(s): GLUCAP in the last 168 hours.  Lipase  No results found for: LIPASE   Urinalysis    Component Value Date/Time   COLORURINE YELLOW (A) 05/27/2019 1109   APPEARANCEUR CLOUDY (A) 05/27/2019 1109   LABSPEC 1.015 05/27/2019 1109   PHURINE 5.0 05/27/2019 1109   GLUCOSEU NEGATIVE 05/27/2019 1109   HGBUR LARGE (A) 05/27/2019 1109   BILIRUBINUR NEGATIVE 05/27/2019 [place] 05/27/2019 1109   PROTEINUR 30 (A) 05/27/2019 1109   NITRITE NEGATIVE 05/27/2019 1109   LEUKOCYTESUR LARGE (A) 05/27/2019 1109     Drugs of Abuse  No results found for: LABOPIA, COCAINSCRNUR, LABBENZ, AMPHETMU, THCU, LABBARB  Radiological Exams on Admission: Dg Chest Port 1 View  Result Date: 05/27/2019 CLINICAL DATA:  Altered mental status. History of chronic lymphocytic leukemia. EXAM: PORTABLE CHEST 1 VIEW COMPARISON:  April 10, 2019. FINDINGS: Port-A-Cath tip is in the superior vena cava. No pneumothorax. There are pleural effusions bilaterally. There is no appreciable edema or consolidation. Heart size and pulmonary vascularity normal. No adenopathy. There is aortic atherosclerosis. Bones are osteoporotic.  No focal bone lesions are evident. IMPRESSION: Pleural effusions bilaterally without demonstrable edema or consolidation. Heart size normal. No adenopathy demonstrable by radiography. Port-A-Cath tip in superior vena cava. Bones osteoporotic. Aortic Atherosclerosis (ICD10-I70.0). Electronically Signed   By: Lowella Grip III M.D.   On: 05/27/2019 11:15    EKG: Personally reviewed by me which shows sinus tachycardia  Assessment/Plan  Principal Problem:   Febrile illness, acute Active Problems:   Chronic lymphocytic leukemia (CLL), B-cell (HCC)   Colostomy status (HCC)   Hydronephrosis with ureteropelvic junction (UPJ) obstruction   Acute lower UTI  Febrile illness with possible sepsis, history of CLL.  Patient is  receiving oncological treatment at [facility].  Unclear of details at this time.  We will continue with broad-spectrum antibiotics for now.  Continue with IV fluids.  Patient was febrile hypotensive on presentation with chronically elevated leukocyte count.  Urinalysis abnormal but patient has PCN.  There Is possibility of urinary tract infection as well.  Lactate was within normal range.  COVID-19 test is pending.  Chest x-ray shows pleural effusion.  Confusion on the background of dementia.. Likely metabolic encephalopathy secondary to infection.  Hold sertraline and gabapentin for now.  Continue trazodone.  History of CLL.  Patient follows up with oncology at [facility].  Hydronephrosis with ureteropelvic junction obstruction.  Status post percutaneous nephrostomy.  Patient does have a pelvic mass as per the history.  Will obtain scan of the abdomen and pelvis.  Renal insufficiency.  Creatinine at 1.27 on presentation with BUN of 40.  Patient does have nephrostomy.  Will closely monitor.  Ostomy status.  Continue ostomy care.  DVT Prophylaxis: SCDs due to thrombocytopenia  Consultant: None  Code Status: DNR  Microbiology  COVID-19 pending, blood cultures x2 urine cultures  Antibiotics: Consider vancomycin and Zosyn for now due to history of cancer treatment possible immunocompromise status.  Could de-escalate by a.m.  Family Communication:  I was unable to reach the son listed as contact   Disposition Plan: Skilled nursing facility.  Patient is from [employer] and rehab.  Severity of Illness: The appropriate patient status for this patient is INPATIENT. Inpatient status is judged to be reasonable and necessary in order to provide the required intensity of service to ensure the patient's safety. The patient's presenting symptoms, physical exam findings, and initial radiographic and laboratory data in the context of their chronic comorbidities is felt to place them at high risk  for further clinical deterioration. Furthermore, it is not anticipated that the patient will be medically stable for discharge from the hospital within 2 midnights of admission. I certify that at the point of admission it is my clinical judgment that the patient will require inpatient hospital care spanning beyond 2 midnights from the point of admission due to high intensity of service, high risk for further deterioration and high frequency of surveillance required.   Signed, Flora Lipps, MD [facility] 05/27/2019

## 2019-05-27 NOTE — Progress Notes (Signed)
Pharmacy Antibiotic Note  Samantha Mata is a 85 y.o. female admitted on 05/27/2019 with fever and confusion.  Pharmacy has been consulted for cefepime and vancomycin dosing.  Pt has PMH significant for small B cell lymphoma, HLD, dementia. Presenting with weakness, limited history available on admission. PT received initial doses of vancomycin, cefepime, and metronidazole in the ED.  Today, 05/27/19  WBC 184 - history of lymphoma  SCr 1.27, CrCl ~ 40 mL/min. Unsure what baseline renal function is  Tmax 101.9  TBW 40.8 kg  Plan:  Cefepime 2 g IV q24h  Vancomycin 1000 mg LD given in ED. Maintenance dose of 500 mg IV q36h  Goal AUC 400-550  Monitor renal function closely  Follow culture data  Check vancomycin levels once at steady state if indicated  Height: 5\' 6"  (167.6 cm) Weight: 90 lb (40.8 kg) IBW/kg (Calculated) : 59.3  Temp (24hrs), Avg:101.3 F (38.5 C), Min:101 F (38.3 C), Max:101.9 F (38.8 C)  Recent Labs  Lab 05/27/19 1109  WBC 184.2*  CREATININE 1.27*  LATICACIDVEN 1.0    Estimated Creatinine Clearance: 20.9 mL/min (A) (by C-G formula based on SCr of 1.27 mg/dL (H)).    Allergies  Allergen Reactions  . Neosporin [Bacitracin-Polymyxin B]    Antimicrobials this admission: vancomycin 11/18 >>  cefepime 11/18 >>   Dose adjustments this admission:  Microbiology results: 11/18 BCx: Sent 11/18 UCx: Sent  11/18 SARS-2: Negative  Thank you for allowing pharmacy to be a part of this patient's care.  Lenis Noon, PharmD 05/27/2019 8:58 PM

## 2019-05-27 NOTE — ED Notes (Signed)
ED TO INPATIENT HANDOFF REPORT  Name/Age/Gender Samantha Mata 85 y.o. female  Code Status   Home/SNF/Other Skilled nursing facility  Chief Complaint weakness possible sepsis  Level of Care/Admitting Diagnosis ED Disposition    ED Disposition Condition Comment   Admit  Hospital Area: [facility] [[id number]]  Level of Care: Telemetry [5]  Admit to tele based on following criteria: Other see comments  Comments: Sepsis hypotension  Covid Evaluation: Asymptomatic Screening Protocol (No Symptoms)  Diagnosis: Sepsis [id number]  Admitting Physician: Flora Lipps [id number]  Attending Physician: Flora Lipps [id number]  Estimated length of stay: past midnight tomorrow  Certification:: I certify this patient will need inpatient services for at least 2 midnights  PT Class (Do Not Modify): Inpatient [101]  PT Acc Code (Do Not Modify): Private [1]       Medical History Past Medical History:  Diagnosis Date  . Anemia   . Arthritis   . Cancer ([place])   . Dementia ([place])   . Hx of pulmonary embolus   . Hyperlipidemia   . Major depressive disorder   . Peripheral neuropathy   . Small cell B-cell lymphoma (HCC)     Allergies Allergies  Allergen Reactions  . Neosporin [Bacitracin-Polymyxin B]     IV Location/Drains/Wounds Patient Lines/Drains/Airways Status   Active Line/Drains/Airways    Name:   Placement date:   Placement time:   Site:   Days:   Peripheral IV 05/27/19 Left Forearm   05/27/19    -    Forearm   less than 1          Labs/Imaging Results for orders placed or performed during the hospital encounter of 05/27/19 (from the past 48 hour(s))  Comprehensive metabolic panel     Status: Abnormal   Collection Time: 05/27/19 11:09 AM  Result Value Ref Range   Sodium 145 135 - 145 mmol/L   Potassium 5.1 3.5 - 5.1 mmol/L   Chloride 110 98 - 111 mmol/L   CO2 23 22 - 32 mmol/L   Glucose, Bld 135 (H) 70 - 99 mg/dL   BUN 40 (H) 8 - 23 mg/dL    Creatinine, Ser 1.27 (H) 0.44 - 1.00 mg/dL   Calcium 9.2 8.9 - 10.3 mg/dL   Total Protein 6.4 (L) 6.5 - 8.1 g/dL   Albumin 3.2 (L) 3.5 - 5.0 g/dL   AST 46 (H) 15 - 41 U/L   ALT 23 0 - 44 U/L   Alkaline Phosphatase 84 38 - 126 U/L   Total Bilirubin 0.6 0.3 - 1.2 mg/dL   GFR calc non Af Amer 38 (L) >60 mL/min   GFR calc Af Amer 45 (L) >60 mL/min   Anion gap 12 5 - 15    Comment: Performed at [facility], [place] [address] [place], [place] [address]  CBC WITH DIFFERENTIAL     Status: Abnormal   Collection Time: 05/27/19 11:09 AM  Result Value Ref Range   WBC 184.2 (HH) 4.0 - 10.5 K/uL    Comment: REPEATED TO VERIFY THIS CRITICAL RESULT HAS VERIFIED AND BEEN CALLED TO [facility]. RN BY LISA EDWARDS ON 11 18 2020 AT [id number], AND HAS BEEN READ BACK. CRITICAL RESULT VERIFIED    RBC 2.93 (L) 3.87 - 5.11 MIL/uL   Hemoglobin 9.0 (L) 12.0 - 15.0 g/dL   HCT 31.5 (L) 36.0 - 46.0 %   MCV 107.5 (H) 80.0 - 100.0 fL   MCH 30.7 26.0 - 34.0 pg   MCHC  28.6 (L) 30.0 - 36.0 g/dL   RDW 22.7 (H) 11.5 - 15.5 %   Platelets 26 (LL) 150 - 400 K/uL    Comment: REPEATED TO VERIFY PLATELET COUNT CONFIRMED BY SMEAR SPECIMEN CHECKED FOR CLOTS Immature Platelet Fraction may be clinically indicated, consider ordering this additional test [id number]    nRBC 0.2 0.0 - 0.2 %   Neutrophils Relative % 1 %   Neutro Abs 2.5 1.7 - 7.7 K/uL   Lymphocytes Relative 92 %   Lymphs Abs 168.5 (H) 0.7 - 4.0 K/uL   Monocytes Relative 6 %   Monocytes Absolute 11.6 (H) 0.1 - 1.0 K/uL   Eosinophils Relative 0 %   Eosinophils Absolute 0.4 0.0 - 0.5 K/uL   Basophils Relative 0 %   Basophils Absolute 0.2 (H) 0.0 - 0.1 K/uL   WBC Morphology ABSOLUTE LYMPHOCYTOSIS     Comment: SMUDGE CELLS   RBC Morphology See Note     Comment: RARE NRBC'S PRESENT   Immature Granulocytes 1 %   Abs Immature Granulocytes 1.06 (H) 0.00 - 0.07 K/uL    Comment: Performed at [facility], [place] [address],  [place], [place] [address]  Urinalysis, Routine w reflex microscopic (not at [facility])     Status: Abnormal   Collection Time: 05/27/19 11:09 AM  Result Value Ref Range   Color, Urine YELLOW (A) YELLOW   APPearance CLOUDY (A) CLEAR   Specific Gravity, Urine 1.015 1.005 - 1.030   pH 5.0 5.0 - 8.0   Glucose, UA NEGATIVE NEGATIVE mg/dL   Hgb urine dipstick LARGE (A) NEGATIVE   Bilirubin Urine NEGATIVE NEGATIVE   Ketones, ur NEGATIVE NEGATIVE mg/dL   Protein, ur 30 (A) NEGATIVE mg/dL   Nitrite NEGATIVE NEGATIVE   Leukocytes,Ua LARGE (A) NEGATIVE   RBC / HPF >50 (H) 0 - 5 RBC/hpf   WBC, UA >50 (H) 0 - 5 WBC/hpf   Bacteria, UA FEW (A) NONE SEEN   WBC Clumps PRESENT    Mucus PRESENT     Comment: Performed at [facility], [place] [address] [place], [place] [address]  Blood gas, venous ([facility], [facility], [facility])     Status: Abnormal   Collection Time: 05/27/19 11:09 AM  Result Value Ref Range   pH, Ven 7.412 7.250 - 7.430   pCO2, Ven 40.3 (L) 44.0 - 60.0 mmHg   pO2, Ven 50.3 (H) 32.0 - 45.0 mmHg   Bicarbonate 25.2 20.0 - 28.0 mmol/L   Acid-Base Excess 1.0 0.0 - 2.0 mmol/L   O2 Saturation 81.8 %   Patient temperature 98.6     Comment: Performed at [facility], [place] [address], [place], [place] [address]  Lactic acid, plasma     Status: None   Collection Time: 05/27/19 11:09 AM  Result Value Ref Range   Lactic Acid, Venous 1.0 0.5 - 1.9 mmol/L    Comment: Performed at [facility], [place] [address], [place], [place] [address]  POC occult blood, ED RN will collect     Status: None   Collection Time: 05/27/19 12:22 PM  Result Value Ref Range   Fecal Occult Bld NEGATIVE NEGATIVE   Dg Chest Port 1 View  Result Date: 05/27/2019 CLINICAL DATA:  Altered mental status. History of chronic lymphocytic leukemia. EXAM: PORTABLE CHEST 1 VIEW COMPARISON:  April 10, 2019. FINDINGS: Port-A-Cath tip is in the superior vena cava. No pneumothorax. There are pleural  effusions bilaterally. There is no appreciable edema or consolidation.  Heart size and pulmonary vascularity normal. No adenopathy. There is aortic atherosclerosis. Bones are osteoporotic.  No focal bone lesions are evident. IMPRESSION: Pleural effusions bilaterally without demonstrable edema or consolidation. Heart size normal. No adenopathy demonstrable by radiography. Port-A-Cath tip in superior vena cava. Bones osteoporotic. Aortic Atherosclerosis (ICD10-I70.0). Electronically Signed   By: Lowella Grip III M.D.   On: 05/27/2019 11:15    Pending Labs Unresulted Labs (From admission, onward)    Start     Ordered   05/27/19 1109  Pathologist smear review  Once,   R     05/27/19 1109   05/27/19 1053  SARS CORONAVIRUS 2 (TAT 6-24 HRS) Nasopharyngeal Nasopharyngeal Swab  (Asymptomatic/Tier 2 Patients Labs)  Once,   STAT    Question Answer Comment  Is this test for diagnosis or screening Screening   Symptomatic for COVID-19 as defined by CDC No   Hospitalized for COVID-19 No   Admitted to ICU for COVID-19 No   Previously tested for COVID-19 No   Resident in a congregate (group) care setting Yes   Employed in healthcare setting No   Pregnant No      05/27/19 1052   05/27/19 1050  Blood Culture (routine x 2)  BLOOD CULTURE X 2,   STAT     05/27/19 1052   05/27/19 1050  Urine culture  ONCE - STAT,   STAT     05/27/19 1052   Signed and Held  Comprehensive metabolic panel  Tomorrow morning,   R     Signed and Held   Signed and Held  CBC  Tomorrow morning,   R     Signed and Held          Vitals/Pain Today's Vitals   05/27/19 1645 05/27/19 1700 05/27/19 1715 05/27/19 1730  BP: (!) 94/54 (!) 97/52 (!) 103/57 (!) 101/58  Pulse: 96 97 96 (!) 103  Resp: 20 (!) 21 19   Temp:      TempSrc:      SpO2: 90% 91% 94% 92%  Weight:        Isolation Precautions No active isolations  Medications Medications  lactated ringers bolus 1,000 mL (0 mLs Intravenous Stopped 05/27/19 1215)     And  lactated ringers bolus 250 mL (0 mLs Intravenous Stopped 05/27/19 1215)  acetaminophen (TYLENOL) suppository 975 mg (975 mg Rectal Given 05/27/19 1211)  vancomycin (VANCOCIN) IVPB 1000 mg/200 mL premix (1,000 mg Intravenous New Bag/Given 05/27/19 1514)  ceFEPIme (MAXIPIME) 2 g in sodium chloride 0.9 % 100 mL IVPB (0 g Intravenous Stopped 05/27/19 1350)  metroNIDAZOLE (FLAGYL) IVPB 500 mg (0 mg Intravenous Stopped 05/27/19 1515)    Mobility non-ambulatory

## 2019-05-28 ENCOUNTER — Other Ambulatory Visit: Payer: Self-pay

## 2019-05-28 DIAGNOSIS — E43 Unspecified severe protein-calorie malnutrition: Secondary | ICD-10-CM | POA: Insufficient documentation

## 2019-05-28 DIAGNOSIS — L8990 Pressure ulcer of unspecified site, unspecified stage: Secondary | ICD-10-CM | POA: Insufficient documentation

## 2019-05-28 LAB — IRON AND TIBC
Iron: 57 ug/dL (ref 28–170)
Saturation Ratios: 40 % — ABNORMAL HIGH (ref 10.4–31.8)
TIBC: 142 ug/dL — ABNORMAL LOW (ref 250–450)
UIBC: 85 ug/dL

## 2019-05-28 LAB — CBC
HCT: 27 % — ABNORMAL LOW (ref 36.0–46.0)
Hemoglobin: 7.6 g/dL — ABNORMAL LOW (ref 12.0–15.0)
MCH: 30.6 pg (ref 26.0–34.0)
MCHC: 28.1 g/dL — ABNORMAL LOW (ref 30.0–36.0)
MCV: 108.9 fL — ABNORMAL HIGH (ref 80.0–100.0)
Platelets: 21 10*3/uL — CL (ref 150–400)
RBC: 2.48 MIL/uL — ABNORMAL LOW (ref 3.87–5.11)
RDW: 21.6 % — ABNORMAL HIGH (ref 11.5–15.5)
WBC: 126.4 10*3/uL (ref 4.0–10.5)
nRBC: 0.2 % (ref 0.0–0.2)

## 2019-05-28 LAB — FERRITIN: Ferritin: 2093 ng/mL — ABNORMAL HIGH (ref 11–307)

## 2019-05-28 LAB — COMPREHENSIVE METABOLIC PANEL
ALT: 19 U/L (ref 0–44)
AST: 36 U/L (ref 15–41)
Albumin: 2.7 g/dL — ABNORMAL LOW (ref 3.5–5.0)
Alkaline Phosphatase: 64 U/L (ref 38–126)
Anion gap: 11 (ref 5–15)
BUN: 34 mg/dL — ABNORMAL HIGH (ref 8–23)
CO2: 22 mmol/L (ref 22–32)
Calcium: 8.5 mg/dL — ABNORMAL LOW (ref 8.9–10.3)
Chloride: 113 mmol/L — ABNORMAL HIGH (ref 98–111)
Creatinine, Ser: 0.91 mg/dL (ref 0.44–1.00)
GFR calc Af Amer: >60 mL/min (ref >60)
GFR calc non Af Amer: 58 mL/min — ABNORMAL LOW (ref >60)
Glucose, Bld: 109 mg/dL — ABNORMAL HIGH (ref 70–99)
Potassium: 4.1 mmol/L (ref 3.5–5.1)
Sodium: 146 mmol/L — ABNORMAL HIGH (ref 135–145)
Total Bilirubin: 0.8 mg/dL (ref 0.3–1.2)
Total Protein: 5.4 g/dL — ABNORMAL LOW (ref 6.5–8.1)

## 2019-05-28 LAB — FOLATE: Folate: 46 ng/mL (ref >5.9)

## 2019-05-28 LAB — MRSA PCR SCREENING: MRSA by PCR: NEGATIVE

## 2019-05-28 LAB — VITAMIN B12: Vitamin B-12: 552 pg/mL (ref 180–914)

## 2019-05-28 MED ORDER — ACETAMINOPHEN 160 MG/5ML PO SOLN
325.0000 mg | Freq: Four times a day (QID) | ORAL | Status: DC | PRN
  Administered 2019-05-28 – 2019-05-29 (×2): 325 mg via ORAL
  Filled 2019-05-28 (×2): qty 20.3

## 2019-05-28 MED ORDER — SODIUM CHLORIDE 0.45 % IV SOLN
INTRAVENOUS | Status: DC
  Administered 2019-05-28 – 2019-05-29 (×2): via INTRAVENOUS

## 2019-05-28 MED ORDER — ENSURE ENLIVE PO LIQD
237.0000 mL | Freq: Two times a day (BID) | ORAL | Status: DC
  Administered 2019-05-28 – 2019-06-01 (×8): 237 mL via ORAL

## 2019-05-28 MED ORDER — ACETAMINOPHEN 650 MG RE SUPP
975.0000 mg | Freq: Once | RECTAL | Status: AC
  Administered 2019-05-28: 975 mg via RECTAL
  Filled 2019-05-28: qty 2

## 2019-05-28 NOTE — Progress Notes (Signed)
Initial Nutrition Assessment  DOCUMENTATION CODES:   Underweight, Severe malnutrition in context of chronic illness  INTERVENTION:   - Continue MVI with minerals daily  - Ensure Enlive po BID, each supplement provides 350 kcal and 20 grams of protein  NUTRITION DIAGNOSIS:   Severe Malnutrition related to chronic illness (small B-cell lymphoma, dementia) as evidenced by severe muscle depletion, severe fat depletion.  GOAL:   Patient will meet greater than or equal to 90% of their needs  MONITOR:   PO intake, Supplement acceptance, Labs, Weight trends, I & O's, Skin, Other (GOC)  REASON FOR ASSESSMENT:   Other (low BMI)    ASSESSMENT:   85 year old female who presented to the ED on 11/18 with AMS. PMH of small B-cell lymphoma undergoing treatment, HLD, depression, dementia, hydronephrosis with ureteropelvic junction obstruction s/p percutaneous nephrostomy.  Noted palliative care consult pending.  Unable to obtain diet and weight history from pt at this time. Pt resting in bed but only mumbled to RD.  No weight history available in chart.  RD will order oral nutrition supplements to aid pt in meeting kcal and protein needs during admission.  Medications reviewed and include: cholecalciferol, ferrous sulfate, magnesium oxide, MVI with minerals, IV abx IVF: NS @ 125 ml/hr  Labs reviewed: sodium 146, chloride 113, BUN 34, hemoglobin 7.6  NUTRITION - FOCUSED PHYSICAL EXAM:    Most Recent Value  Orbital Region  Severe depletion  Upper Arm Region  Moderate depletion  Thoracic and Lumbar Region  Severe depletion  Buccal Region  Severe depletion  Temple Region  Severe depletion  Clavicle Bone Region  Severe depletion  Clavicle and Acromion Bone Region  Severe depletion  Scapular Bone Region  Severe depletion  Dorsal Hand  Severe depletion  Patellar Region  Severe depletion  Anterior Thigh Region  Severe depletion  Posterior Calf Region  Severe depletion  Edema (RD  Assessment)  None  Hair  Reviewed  Eyes  Reviewed  Mouth  Reviewed  Skin  Reviewed  Nails  Reviewed       Diet Order:   Diet Order            Diet regular Room service appropriate? Yes; Fluid consistency: Thin  Diet effective now              EDUCATION NEEDS:   No education needs have been identified at this time  Skin:  Skin Assessment: Skin Integrity Issues: Skin Integrity Issues: Stage I: sacrum  Last BM:  no documented BM (colostomy)  Height:   Ht Readings from Last 1 Encounters:  05/27/19 5\' 6"  (1.676 m)    Weight:   Wt Readings from Last 1 Encounters:  05/28/19 40.8 kg    Ideal Body Weight:  59.1 kg  BMI:  Body mass index is 14.52 kg/m.  Estimated Nutritional Needs:   Kcal:  1150-1350  Protein:  55-65 grams  Fluid:  >/= 1.2 L    Gaynell Face, MS, RD, LDN Inpatient Clinical Dietitian Pager: [phone] Weekend/After Hours: [phone]

## 2019-05-28 NOTE — Progress Notes (Signed)
PROGRESS NOTE    Samantha Mata  [id number] DOB: [birth year] DOA: 05/27/2019 PCP: Patient, No Pcp Per   Brief Narrative: Patient is 85 year old female with history of small B-cell lymphoma, hyperlipidemia, depression, dementia who was brought to the emergency department for the evaluation of generalized weakness.  She is from a skilled nursing facility.  Patient is unable to contribute anything to the history.  As per the report, she is currently getting treatment for a seizure at [facility].  Patient has a colostomy and bilateral nephrostomy tubes .  She has history of pelvic mass.  When she presented she was found to be hypotensive, febrile, tachycardic.  Sepsis was suspected.  UA was impressive for urinary tract infection.  Patient was started on broad-spectrum antibiotics.  Assessment & Plan:   Principal Problem:   Febrile illness, acute Active Problems:   Chronic lymphocytic leukemia (CLL), B-cell (HCC)   Colostomy status (HCC)   Hydronephrosis with ureteropelvic junction (UPJ) obstruction   Acute lower UTI   Sepsis (HCC)   Protein-calorie malnutrition, severe   Pressure injury of skin  Suspected sepsis: Still febrile this morning.  Currently hemodynamically stable.  Continue current broad-spectrum antibiotics.  We will follow-up cultures.  Source of sepsis is unclear but could be urine. Lactic acid normal.  Covid test 19 test negative.  Chest x-ray showed  pleural effusion bilaterally but no pneumonia.  CLL/leukocytosis: Severe leukocytosis.  Follows with oncology at [facility] with Dr Humphrey Rolls.  Chemotherapy was stopped because she was found to be resistant.  She has history of colon cancer dx in 1992 and underwent colostomy for bowel obstruction in 1997.  Hydronephrosis with ureteropelvic junction obstruction: Has history of pelvic mass.  She is status post percutaneous nephrostomy.  Nephrostomy draining clear urine this morning. Patient is also status post  colostomy. CT abdomen/pelvis done on October 2020 showed Large tumor mass within pelvis 11.6 x 11.7 x 12.3 cm in size , bilateral distal ureteral obstruction with hydroureteronephrosis.  Findings were consistent with leukemia or lymphoma.  Renal insufficiency: Creatinine of 1.27.  BUN of 40 on presentation.Improved with IV fluids.  We will continue to monitor  Altered mental status/ dementia: Patient is currently confused.   Current confusion could be due to acute metabolic encephalopathy from sepsis.  As per the son, she does not have a clear diagnosis of dementia but she is increasingly confused since last few weeks  Microcytic anemia: MCV of 108.  Hemoglobin of 7.6.  Will check iron studies, vitamin [id number], folic acid.  Debility/deconditioning/multiple comorbidities/advanced age: We will request for palliative care evaluation.  Patient is DNR.  Looks like she is a hospice candidate.   Nutrition Problem: Severe Malnutrition Etiology: chronic illness(small B-cell lymphoma, dementia)      DVT prophylaxis: SCD Code Status: Full Family Communication: Called son today for update Disposition Plan: Undetermined.Medically unstable   Consultants: Palliative care  Procedures: None  Antimicrobials:  Anti-infectives (From admission, onward)   Start     Dose/Rate Route Frequency Ordered Stop   05/29/19 0300  vancomycin (VANCOCIN) 500 mg in sodium chloride 0.9 % 100 mL IVPB     500 mg 100 mL/hr over 60 Minutes Intravenous Every 36 hours 05/27/19 2053     05/28/19 1200  ceFEPIme (MAXIPIME) 2 g in sodium chloride 0.9 % 100 mL IVPB     2 g 200 mL/hr over 30 Minutes Intravenous Every 24 hours 05/27/19 2058     05/28/19 1000  valACYclovir (VALTREX) tablet 500 mg  500 mg Oral Daily 05/27/19 1955     05/27/19 1215  vancomycin (VANCOCIN) IVPB 1000 mg/200 mL premix     1,000 mg 200 mL/hr over 60 Minutes Intravenous STAT 05/27/19 1210 05/27/19 1614   05/27/19 1215  ceFEPIme (MAXIPIME) 2 g in  sodium chloride 0.9 % 100 mL IVPB     2 g 200 mL/hr over 30 Minutes Intravenous STAT 05/27/19 1210 05/27/19 1350   05/27/19 1215  metroNIDAZOLE (FLAGYL) IVPB 500 mg     500 mg 100 mL/hr over 60 Minutes Intravenous STAT 05/27/19 1210 05/27/19 1515      Subjective:  Patient seen and examined at bedside this morning.  Currently hemodynamically stable.  Febrile this morning.  Did not participate with any meaningful conversation.  Very lethargic. Objective: Vitals:   05/28/19 0430 05/28/19 0819 05/28/19 1130 05/28/19 1401  BP:  (!) 102/59  (!) 107/57  Pulse:  92  90  Resp:  18  (!) 24  Temp:  98.7 F (37.1 C) (!) 100.5 F (38.1 C) 97.6 F (36.4 C)  TempSrc:  Axillary Axillary Oral  SpO2:  95%  94%  Weight: 40.8 kg     Height:        Intake/Output Summary (Last 24 hours) at 05/28/2019 1407 Last data filed at 05/28/2019 1000 Gross per 24 hour  Intake 1989.01 ml  Output 225 ml  Net 1764.01 ml   Filed Weights   05/27/19 1042 05/28/19 0430  Weight: 40.8 kg 40.8 kg    Examination:  General exam: Extremely debilitated, deconditioned, chronically ill looking, generalized weakness  HEENT:PERRL, Ear/Nose normal on gross exam Respiratory system: Bilateral decreased air entry on the bases Cardiovascular system: S1 & S2 heard, RRR. No JVD, murmurs, rubs, gallops or clicks. No pedal edema. Gastrointestinal system: Abdomen is nondistended, soft and nontender. Colostomy with dark stool, bilateral nephrostomy tubes.   Central nervous system: Not Alert and oriented.  Extremities: No edema, no clubbing ,no cyanosis Skin: No rashes, lesions or ulcers,no icterus ,no pallor     Data Reviewed: I have personally reviewed following labs and imaging studies  CBC: Recent Labs  Lab 05/27/19 1109 05/28/19 0530  WBC 184.2* 126.4*  NEUTROABS 2.5  --   HGB 9.0* 7.6*  HCT 31.5* 27.0*  MCV 107.5* 108.9*  PLT 26* 21*   Basic Metabolic Panel: Recent Labs  Lab 05/27/19 1109 05/28/19  0530  NA 145 146*  K 5.1 4.1  CL 110 113*  CO2 23 22  GLUCOSE 135* 109*  BUN 40* 34*  CREATININE 1.27* 0.91  CALCIUM 9.2 8.5*   GFR: Estimated Creatinine Clearance: 29.1 mL/min (by C-G formula based on SCr of 0.91 mg/dL). Liver Function Tests: Recent Labs  Lab 05/27/19 1109 05/28/19 0530  AST 46* 36  ALT 23 19  ALKPHOS 84 64  BILITOT 0.6 0.8  PROT 6.4* 5.4*  ALBUMIN 3.2* 2.7*   No results for input(s): LIPASE, AMYLASE in the last 168 hours. No results for input(s): AMMONIA in the last 168 hours. Coagulation Profile: No results for input(s): INR, PROTIME in the last 168 hours. Cardiac Enzymes: No results for input(s): CKTOTAL, CKMB, CKMBINDEX, TROPONINI in the last 168 hours. BNP (last 3 results) No results for input(s): PROBNP in the last 8760 hours. HbA1C: No results for input(s): HGBA1C in the last 72 hours. CBG: No results for input(s): GLUCAP in the last 168 hours. Lipid Profile: No results for input(s): CHOL, HDL, LDLCALC, TRIG, CHOLHDL, LDLDIRECT in the last 72 hours. Thyroid Function  Tests: No results for input(s): TSH, T4TOTAL, FREET4, T3FREE, THYROIDAB in the last 72 hours. Anemia Panel: No results for input(s): VITAMINB12, FOLATE, FERRITIN, TIBC, IRON, RETICCTPCT in the last 72 hours. Sepsis Labs: Recent Labs  Lab 05/27/19 1109  LATICACIDVEN 1.0    Recent Results (from the past 240 hour(s))  Urine culture     Status: None (Preliminary result)   Collection Time: 05/27/19 10:50 AM   Specimen: In/Out Cath Urine  Result Value Ref Range Status   Specimen Description   Final    IN/OUT CATH URINE LT SIDE Performed at [facility] [address] [place], [place] [address]    Special Requests   Final    NONE Performed at [facility], [place] [address] [place], [place] [address]    Culture   Final    CULTURE REINCUBATED FOR BETTER GROWTH Performed at [facility], [place] [address] [place], [place] [address]     Report Status PENDING  Incomplete  SARS CORONAVIRUS 2 (TAT 6-24 HRS) Nasopharyngeal Nasopharyngeal Swab     Status: None   Collection Time: 05/27/19 10:53 AM   Specimen: Nasopharyngeal Swab  Result Value Ref Range Status   SARS Coronavirus 2 NEGATIVE NEGATIVE Final    Comment: (NOTE) SARS-CoV-2 target nucleic acids are NOT DETECTED. The SARS-CoV-2 RNA is generally detectable in upper and lower respiratory specimens during the acute phase of infection. Negative results do not preclude SARS-CoV-2 infection, do not rule out co-infections with other pathogens, and should not be used as the sole basis for treatment or other patient management decisions. Negative results must be combined with clinical observations, patient history, and epidemiological information. The expected result is Negative. Fact Sheet for Patients: SugarRoll.be Fact Sheet for Healthcare Providers: https://www.woods-mathews.com/ This test is not yet approved or cleared by the [place] FDA and  has been authorized for detection and/or diagnosis of SARS-CoV-2 by FDA under an Emergency Use Authorization (EUA). This EUA will remain  in effect (meaning this test can be used) for the duration of the COVID-19 declaration under Section 56 4(b)(1) of the Act, 21 U.S.C. section 360bbb-3(b)(1), unless the authorization is terminated or revoked sooner. Performed at [facility], [place] [address], [place], [place] [address]   Blood Culture (routine x 2)     Status: None (Preliminary result)   Collection Time: 05/27/19 10:55 AM   Specimen: BLOOD  Result Value Ref Range Status   Specimen Description   Final    BLOOD RIGHT ANTECUBITAL Performed at [facility] [address], [place], [place] [address]    Special Requests   Final    BOTTLES DRAWN AEROBIC AND ANAEROBIC Blood Culture adequate volume Performed at [facility]  [address], [place], [place] [address]    Culture   Final    NO GROWTH < 24 HOURS Performed at [facility] [address], [place], [place] [address]    Report Status PENDING  Incomplete  Blood Culture (routine x 2)     Status: None (Preliminary result)   Collection Time: 05/27/19 11:09 AM   Specimen: BLOOD  Result Value Ref Range Status   Specimen Description   Final    BLOOD LEFT ANTECUBITAL Performed at [facility] [address], [place],  [address]    Special Requests   Final    BOTTLES DRAWN AEROBIC AND ANAEROBIC Blood Culture adequate volume Performed at [facility],  [place] [address] [place], [place] [address]    Culture   Final    NO GROWTH < 24 HOURS Performed at [facility] [address], [place], [place] [address]    Report Status PENDING  Incomplete  MRSA PCR Screening     Status: None   Collection Time: 05/28/19  5:00 AM   Specimen: Nasal Mucosa; Nasopharyngeal  Result Value Ref Range Status   MRSA by PCR NEGATIVE NEGATIVE Final    Comment:        The GeneXpert MRSA Assay (FDA approved for NASAL specimens only), is one component of a comprehensive MRSA colonization surveillance program. It is not intended to diagnose MRSA infection nor to guide or monitor treatment for MRSA infections. Performed at [facility], [place] [address] [place], [place] [address]          Radiology Studies: Dg Chest Port 1 View  Result Date: 05/27/2019 CLINICAL DATA:  Altered mental status. History of chronic lymphocytic leukemia. EXAM: PORTABLE CHEST 1 VIEW COMPARISON:  April 10, 2019. FINDINGS: Port-A-Cath tip is in the superior vena cava. No pneumothorax. There are pleural effusions bilaterally. There is no appreciable edema or consolidation. Heart size and pulmonary vascularity normal. No adenopathy. There is aortic atherosclerosis. Bones are osteoporotic.  No focal bone lesions are evident.  IMPRESSION: Pleural effusions bilaterally without demonstrable edema or consolidation. Heart size normal. No adenopathy demonstrable by radiography. Port-A-Cath tip in superior vena cava. Bones osteoporotic. Aortic Atherosclerosis (ICD10-I70.0). Electronically Signed   By: Lowella Grip III M.D.   On: 05/27/2019 11:15        Scheduled Meds: . allopurinol  100 mg Oral Daily  . atorvastatin  10 mg Oral QHS  . budesonide (PULMICORT) nebulizer solution  0.25 mg Nebulization BID  . cholecalciferol  2,000 Units Oral Daily  . feeding supplement (ENSURE ENLIVE)  237 mL Oral BID BM  . ferrous sulfate  325 mg Oral BID WC  . magnesium oxide  400 mg Oral Daily  . multivitamin with minerals  1 tablet Oral Daily  . traZODone  50 mg Oral QHS  . valACYclovir  500 mg Oral Daily   Continuous Infusions: . sodium chloride 125 mL/hr at 05/28/19 0545  . ceFEPime (MAXIPIME) IV 2 g (05/28/19 1200)  . [START ON 05/29/2019] vancomycin       LOS: 1 day    Time spent: 35 mins,More than 50% of that time was spent in counseling and/or coordination of care.      Shelly Coss, MD [facility] Pager [phone]  If 7PM-7AM, please contact night-coverage [email or web address] Password [email or web address] 05/28/2019, 2:07 PM

## 2019-05-28 NOTE — Progress Notes (Signed)
OT Cancellation Note  Patient Details Name: Samantha Mata MRN: [id number] DOB: [birth year]   Cancelled Treatment:    Reason Eval/Treat Not Completed: Other (comment).  Just finished with PT and is fatiqued. Will check back tomorrow.  Dasean Brow 05/28/2019, 2:44 PM  Lesle Chris, OTR/L Acute Rehabilitation Services [phone] [facility] pager [phone] office 05/28/2019

## 2019-05-28 NOTE — Evaluation (Signed)
Physical Therapy Evaluation Patient Details Name: Samantha Mata MRN: [id number] DOB: Nov 16, 1931 Today's Date: 05/28/2019   History of Present Illness  85 y.o. female with history of small B-cell lymphoma, hyperlipidemia, depression, dementia, was brought in hospital for evaluation of generalized weakness, fever, hypotension. Dx of possible sepsis.  Clinical Impression  Pt admitted with above diagnosis. Total assist for supine to sit, pt sat at edge of bed x 3 minutes with min to mod assist for balance. Pt unable to follow commands and did not respond to orientation questions. SNF recommended.  Pt currently with functional limitations due to the deficits listed below (see PT Problem List). Pt will benefit from skilled PT to increase their independence and safety with mobility to allow discharge to the venue listed below.       Follow Up Recommendations SNF;Supervision/Assistance - 24 hour    Equipment Recommendations  None recommended by PT    Recommendations for Other Services       Precautions / Restrictions Precautions Precautions: Fall Precaution Comments: colostomy, B nephrostomy drains Restrictions Weight Bearing Restrictions: No      Mobility  Bed Mobility Overal bed mobility: Needs Assistance Bed Mobility: Sit to Supine;Supine to Sit     Supine to sit: Total assist;+2 for physical assistance Sit to supine: Total assist;+2 for physical assistance   General bed mobility comments: pt less than 5%, assist to raise trunk and advance LEs to edge of bed  Transfers                    Ambulation/Gait                Stairs            Wheelchair Mobility    Modified Rankin (Stroke Patients Only)       Balance Overall balance assessment: Needs assistance Sitting-balance support: Feet supported Sitting balance-Leahy Scale: Poor Sitting balance - Comments: posterior lean initially requiring mod A, then min/guard Postural control: Posterior  lean                                   Pertinent Vitals/Pain Pain Assessment: Faces Faces Pain Scale: No hurt    Home Living Family/patient expects to be discharged to:: Skilled nursing facility                      Prior Function Level of Independence: Needs assistance         Comments: pt unable to provide PLOF, she did not respond to questions     Hand Dominance        Extremity/Trunk Assessment   Upper Extremity Assessment Upper Extremity Assessment: Difficult to assess due to impaired cognition    Lower Extremity Assessment Lower Extremity Assessment: Difficult to assess due to impaired cognition(pt did not respond to commands to extend BLEs in sitting, did not assist with PROM for long arc quads)    Cervical / Trunk Assessment Cervical / Trunk Assessment: Kyphotic  Communication   Communication: Expressive difficulties;Receptive difficulties  Cognition Arousal/Alertness: Lethargic Behavior During Therapy: WFL for tasks assessed/performed Overall Cognitive Status: No family/caregiver present to determine baseline cognitive functioning                                 General Comments: pt did not respond to questions/commands, noted h/o dementia  General Comments      Exercises     Assessment/Plan    PT Assessment Patient needs continued PT services  PT Problem List Decreased activity tolerance;Decreased balance;Decreased strength;Decreased mobility       PT Treatment Interventions Therapeutic activities;Therapeutic exercise;Functional mobility training;Patient/family education    PT Goals (Current goals can be found in the Care Plan section)  Acute Rehab PT Goals PT Goal Formulation: Patient unable to participate in goal setting Time For Goal Achievement: 06/11/19 Potential to Achieve Goals: Fair    Frequency Min 2X/week   Barriers to discharge        Co-evaluation               AM-PAC PT "6  Clicks" Mobility  Outcome Measure Help needed turning from your back to your side while in a flat bed without using bedrails?: Total Help needed moving from lying on your back to sitting on the side of a flat bed without using bedrails?: Total Help needed moving to and from a bed to a chair (including a wheelchair)?: Total Help needed standing up from a chair using your arms (e.g., wheelchair or bedside chair)?: Total Help needed to walk in hospital room?: Total Help needed climbing 3-5 steps with a railing? : Total 6 Click Score: 6    End of Session Equipment Utilized During Treatment: Gait belt Activity Tolerance: Patient limited by fatigue Patient left: in bed;with call bell/phone within reach;with nursing/sitter in room Nurse Communication: Mobility status;Need for lift equipment PT Visit Diagnosis: Muscle weakness (generalized) (M62.81);Difficulty in walking, not elsewhere classified (R26.2);Adult, failure to thrive (R62.7)    Time: [id number] PT Time Calculation (min) (ACUTE ONLY): 16 min   Charges:   PT Evaluation $PT Eval Moderate Complexity: 1 Mod          Philomena Doheny PT 05/28/2019  Acute Rehabilitation Services Pager [phone] Office [phone]

## 2019-05-28 NOTE — Progress Notes (Deleted)
Pt's temp 101.9 rectally upon assessment. Pt given Tylenol. Pt's temp came down to 100.5 but now spiked back up to 101.4. Pt also noted to have a pinkish white discharge appearing to come from vagina. On call provider notified.

## 2019-05-28 NOTE — NC FL2 (Signed)
[place] LEVEL OF CARE SCREENING TOOL     IDENTIFICATION  Patient Name: Samantha Mata Birthdate: 10/07/1931 Sex: female Admission Date (Current Location): 05/27/2019  [facility] and [place] Number:  Herbalist and Address:  [facility],  [place] [address] [place]      Provider Number:    Attending Physician Name and Address:  Shelly Coss, MD  Relative Name and Phone Number:  Ivelis Mimbs son [phone]    Current Level of Care: Hospital Recommended Level of Care: [facility] Prior Approval Number:    Date Approved/Denied:   PASRR Number: [id number]  Discharge Plan: SNF    Current Diagnoses: Patient Active Problem List   Diagnosis Date Noted  . Acute lower UTI 05/27/2019  . Sepsis ([place]) 05/27/2019  . Hydronephrosis with ureteropelvic junction (UPJ) obstruction 04/20/2019  . Febrile illness, acute 04/02/2019  . Chronic lymphocytic leukemia (CLL), B-cell ([place]) 12/20/2014  . Colostomy status ([place]) 12/20/2014    Orientation RESPIRATION BLADDER Height & Weight     Self  O2(02 3l [place] continuous) Incontinent Weight: 40.8 kg Height:  5\' 6"  (167.6 cm)  BEHAVIORAL SYMPTOMS/MOOD NEUROLOGICAL BOWEL NUTRITION STATUS      Colostomy Diet  AMBULATORY STATUS COMMUNICATION OF NEEDS Skin   Limited Assist Verbally Surgical wounds(nephrostomy;colostomy sites)                       Personal Care Assistance Level of Assistance  Bathing, Feeding, Dressing Bathing Assistance: Limited assistance Feeding assistance: Limited assistance Dressing Assistance: Limited assistance     Functional Limitations Info  Sight, Hearing, Speech Sight Info: Impaired(eyeglasses) Hearing Info: Adequate Speech Info: Adequate    SPECIAL CARE FACTORS FREQUENCY  PT (By licensed PT), OT (By licensed OT)     PT Frequency: 5x week OT Frequency: 5x week            Contractures Contractures Info: Not present    Additional  Factors Info  Code Status, Allergies Code Status Info: DNR Allergies Info: Neosporin           Current Medications (05/28/2019):  This is the current hospital active medication list Current Facility-Administered Medications  Medication Dose Route Frequency Provider Last Rate Last Dose  . 0.9 %  sodium chloride infusion   Intravenous Continuous Pokhrel, Laxman, MD 125 mL/hr at 05/28/19 0545    . acetaminophen (TYLENOL) 160 MG/5ML solution 325 mg  325 mg Oral Q6H PRN Shelly Coss, MD      . acetaminophen (TYLENOL) tablet 650 mg  650 mg Oral Q6H PRN Pokhrel, Laxman, MD       Or  . acetaminophen (TYLENOL) suppository 650 mg  650 mg Rectal Q6H PRN Pokhrel, Laxman, MD   650 mg at 05/27/19 2046  . allopurinol (ZYLOPRIM) tablet 100 mg  100 mg Oral Daily Bodenheimer, Clenton Pare, NP   Stopped at 05/28/19 1003  . atorvastatin (LIPITOR) tablet 10 mg  10 mg Oral QHS Pokhrel, Laxman, MD      . budesonide (PULMICORT) nebulizer solution 0.25 mg  0.25 mg Nebulization BID Lenis Noon, RPH   0.25 mg at 05/28/19 [id number]  . ceFEPIme (MAXIPIME) 2 g in sodium chloride 0.9 % 100 mL IVPB  2 g Intravenous Q24H Lenis Noon, RPH 200 mL/hr at 05/28/19 1200 2 g at 05/28/19 1200  . cholecalciferol (VITAMIN D) tablet 2,000 Units  2,000 Units Oral Daily Pokhrel, Laxman, MD   Stopped at 05/28/19 1004  .  ferrous sulfate tablet 325 mg  325 mg Oral BID WC Bodenheimer, Charles A, NP   Stopped at 05/28/19 1004  . magnesium oxide (MAG-OX) tablet 400 mg  400 mg Oral Daily Pokhrel, Laxman, MD   Stopped at 05/28/19 1004  . multivitamin with minerals tablet 1 tablet  1 tablet Oral Daily Pokhrel, Laxman, MD   Stopped at 05/28/19 1004  . ondansetron (ZOFRAN) tablet 4 mg  4 mg Oral Q6H PRN Pokhrel, Laxman, MD       Or  . ondansetron (ZOFRAN) injection 4 mg  4 mg Intravenous Q6H PRN Pokhrel, Laxman, MD      . polyethylene glycol (MIRALAX / GLYCOLAX) packet 17 g  17 g Oral Daily PRN Pokhrel, Laxman, MD      . traZODone (DESYREL)  tablet 50 mg  50 mg Oral QHS Pokhrel, Laxman, MD      . valACYclovir (VALTREX) tablet 500 mg  500 mg Oral Daily Pokhrel, Laxman, MD   Stopped at 05/28/19 1005  . [START ON 05/29/2019] vancomycin (VANCOCIN) 500 mg in sodium chloride 0.9 % 100 mL IVPB  500 mg Intravenous Q36H Lenis Noon, [facility]         Discharge Medications: Please see discharge summary for a list of discharge medications.  Relevant Imaging Results:  Relevant Lab Results:   Additional Information ss[id number], [place], [place]

## 2019-05-28 NOTE — TOC Initial Note (Signed)
Transition of Care [facility]) - Initial/Assessment Note    Patient Details  Name: Samantha Mata MRN: [id number] Date of Birth: Jul 13, 1931  Transition of Care [facility] CM/SW Contact:    Dessa Phi, RN Phone Number: 05/28/2019, 12:10 PM  Clinical Narrative:[facility] rep Irine Seal following for return.Noted palliative cons-await recc. Son is trying to pursue LTC placement.                   Expected Discharge Plan: Skilled Nursing Facility Barriers to Discharge: Continued Medical Work up   Patient Goals and CMS Choice        Expected Discharge Plan and Services Expected Discharge Plan: [place]   Discharge Planning Services: CM Consult Post Acute Care Choice: [place]                                        Prior Living Arrangements/Services   Lives with:: Facility Resident Patient language and need for interpreter reviewed:: Yes Do you feel safe going back to the place where you live?: Yes      Need for Family Participation in Patient Care: No (Comment) Care giver support system in place?: Yes (comment)   Criminal Activity/Legal Involvement Pertinent to Current Situation/Hospitalization: No - Comment as needed  Activities of Daily Living Home Assistive Devices/Equipment: Blood pressure cuff, Hospital bed, Grab bars in shower, Hand-held shower hose, Grab bars around toilet, Scales, Wheelchair(Colosotmy, bilateral nephrostomy tubes-[facility] has necessary equipment for their residents) ADL Screening (condition at time of admission) Patient's cognitive ability adequate to safely complete daily activities?: No Is the patient deaf or have difficulty hearing?: No Does the patient have difficulty seeing, even when wearing glasses/contacts?: No Does the patient have difficulty concentrating, remembering, or making decisions?: Yes Patient able to express need for assistance with ADLs?: Yes Does the patient have difficulty dressing  or bathing?: Yes Independently performs ADLs?: No Communication: Independent Dressing (OT): Dependent Is this a change from baseline?: Change from baseline, expected to last >3 days Grooming: Dependent Is this a change from baseline?: Change from baseline, expected to last >3 days Feeding: Dependent Is this a change from baseline?: Change from baseline, expected to last >3 days Bathing: Dependent Is this a change from baseline?: Change from baseline, expected to last >3 days Toileting: Dependent Is this a change from baseline?: Change from baseline, expected to last >3days In/Out Bed: Dependent Is this a change from baseline?: Change from baseline, expected to last >3 days Walks in Home: Dependent Is this a change from baseline?: Change from baseline, expected to last >3 days Does the patient have difficulty walking or climbing stairs?: Yes(secondary to weakness) Weakness of Legs: Both Weakness of Arms/Hands: Both  Permission Sought/Granted Permission sought to share information with : Case Manager Permission granted to share information with : Yes, Verbal Permission Granted  Share Information with NAME: Tessy Feider [id number]  Permission granted to share info w AGENCY: [facility]        Emotional Assessment Appearance:: Appears stated age Attitude/Demeanor/Rapport: Gracious Affect (typically observed): Accepting Orientation: : Oriented to Self Alcohol / Substance Use: Not Applicable Psych Involvement: No (comment)  Admission diagnosis:  Urinary tract infection without hematuria, site unspecified [N39.0] Patient Active Problem List   Diagnosis Date Noted  . Acute lower UTI 05/27/2019  . Sepsis ([place]) 05/27/2019  . Hydronephrosis with ureteropelvic junction (UPJ) obstruction 04/20/2019  . Febrile illness,  acute 04/02/2019  . Chronic lymphocytic leukemia (CLL), B-cell ([place]) 12/20/2014  . Colostomy status ([place]) 12/20/2014   PCP:  Patient, No Pcp Per Pharmacy:  No  Pharmacies Listed    Social Determinants of Health (SDOH) Interventions    Readmission Risk Interventions No flowsheet data found.

## 2019-05-28 NOTE — Evaluation (Signed)
Clinical/Bedside Swallow Evaluation Patient Details  Name: Samantha Mata MRN: [id number] Date of Birth: Feb 25, 1932  Today's Date: 05/28/2019 Time: SLP Start Time (ACUTE ONLY): 1219 SLP Stop Time (ACUTE ONLY): 1246 SLP Time Calculation (min) (ACUTE ONLY): 27 min  Past Medical History:  Past Medical History:  Diagnosis Date  . Anemia   . Arthritis   . Cancer ([place])   . Dementia ([place])   . Hx of pulmonary embolus   . Hyperlipidemia   . Major depressive disorder   . Peripheral neuropathy   . Small cell B-cell lymphoma [facility])    Past Surgical History:  Past Surgical History:  Procedure Laterality Date  . ABDOMINAL HYSTERECTOMY    . APPENDECTOMY    . Bilateral Nephrostomy Tube Placement    . COLON SURGERY  1960's   colostomy   HPI:  pt is an 85 yo female adm to [facility] with AMS and fever.  Pt has been at [facility]. She has h/o CLL, small cell B lymphoma, peripheral neuropathy, major depressive d/o, dementia, cancer with colostomy bag and concern for probable UTI. Swallow evaluation ordered.   Assessment / Plan / Recommendation Clinical Impression  Pt presents with fatigue at this time which is impacting her ability to take in nutrition safely and efficiently.  She did not demonstrate any overt indication of aspiration with all po observed *graham cracker, applesauce, applejuice and water.  Pt at times speaks incoherently and inconsistently answers basic questions. She did articulate that she "wants to die" and "I like it" referring to apple juice.   However pt is weak, dysarthric and will cease masticating and hold solids in her mouth during several respiratory cycles which increase her aspiration/malnutrition risk.  She demonstrates audible swallow and frequent belching post-=swallow concerning for her esophageal function.  Will modify diet to diminish her aspiration risk and increase swallow efficiency.  After completion of snack, pt demonstrated strong productive cough to viscous white  secretions and spit into tissue with SlP direction.  Recommend small frequent meals to maximize intake comfortably.  Note plan for a palliative consult.  Per RN, son reports pt has not had dysphagia but it is questioned during every hospitalization.  Informed RN/NT and pt of plan/recommendations. SLP Visit Diagnosis: Dysphagia, oral phase (R13.11);Dysphagia, pharyngoesophageal phase (R13.14)    Aspiration Risk  Moderate aspiration risk    Diet Recommendation Thin liquid(full liquids)   Liquid Administration via: Cup;Straw Medication Administration: Crushed with puree Supervision: Full supervision/cueing for compensatory strategies Compensations: Slow rate;Small sips/bites Postural Changes: Seated upright at 90 degrees;Remain upright for at least 30 minutes after po intake    Other  Recommendations   n/a  Follow up Recommendations   TBD     Frequency and Duration min 1 x/week  1 week       Prognosis Prognosis for Safe Diet Advancement: Fair Barriers to Reach Goals: Cognitive deficits;Time post onset      Swallow Study   General Date of Onset: 05/28/19 HPI: pt is an 85 yo female adm to [facility] with AMS and fever.  Pt has been at [facility]. She has h/o CLL, small cell B lymphoma, peripheral neuropathy, major depressive d/o, dementia, cancer with colostomy bag and concern for probable UTI. Swallow evaluation ordered. Type of Study: Bedside Swallow Evaluation Diet Prior to this Study: Regular;Thin liquids Temperature Spikes Noted: No Respiratory Status: Nasal cannula History of Recent Intubation: No Behavior/Cognition: Alert;Cooperative;Pleasant mood Oral Cavity Assessment: Within Functional Limits Oral Cavity - Dentition: Missing dentition Vision: Functional for  self-feeding Self-Feeding Abilities: Able to feed self Patient Positioning: Upright in bed Baseline Vocal Quality: Low vocal intensity Volitional Cough: Strong Volitional Swallow: Able to elicit    Oral/Motor/Sensory  Function Overall Oral Motor/Sensory Function: Generalized oral weakness(pt did not follow directions for oral motor exam, demonstrates gross weakness with difficulty holding up her head and decreased ability to form suction on straw)   Ice Chips Ice chips: Not tested   Thin Liquid Thin Liquid: Impaired Presentation: Spoon;Straw Oral Phase Impairments: Reduced lingual movement/coordination;Reduced labial seal Oral Phase Functional Implications: Prolonged oral transit Pharyngeal  Phase Impairments: Multiple swallows Other Comments: audible swallow    Nectar Thick Nectar Thick Liquid: Not tested   Honey Thick Honey Thick Liquid: Not tested   Puree Puree: Impaired Presentation: Spoon Oral Phase Impairments: Reduced labial seal Oral Phase Functional Implications: Prolonged oral transit Other Comments: audible swallow   Solid     Solid: Impaired Oral Phase Impairments: Reduced lingual movement/coordination;Impaired mastication;Poor awareness of bolus;Reduced labial seal Oral Phase Functional Implications: Oral holding;Impaired mastication;Prolonged oral transit Other Comments: pt ceases mastication and orally holds during multiple deep breath cycles - respirations intermittently sounded congested, decreased awareness/fatigue is concerning for aspiration of solids, provided her with puree to aid oral transiting      Macario Golds 05/28/2019,1:42 PM   Luanna Salk, [place] [facility] SLP [place] Pager [phone] Office [phone]

## 2019-05-29 ENCOUNTER — Other Ambulatory Visit: Payer: Self-pay

## 2019-05-29 DIAGNOSIS — Z7189 Other specified counseling: Secondary | ICD-10-CM

## 2019-05-29 DIAGNOSIS — C9110 Chronic lymphocytic leukemia of B-cell type not having achieved remission: Secondary | ICD-10-CM

## 2019-05-29 DIAGNOSIS — Z515 Encounter for palliative care: Secondary | ICD-10-CM

## 2019-05-29 LAB — CBC WITH DIFFERENTIAL/PLATELET
Abs Immature Granulocytes: 0.66 10*3/uL — ABNORMAL HIGH (ref 0.00–0.07)
Basophils Absolute: 0.2 10*3/uL — ABNORMAL HIGH (ref 0.0–0.1)
Basophils Relative: 0 %
Eosinophils Absolute: 0.5 10*3/uL (ref 0.0–0.5)
Eosinophils Relative: 1 %
HCT: 25.8 % — ABNORMAL LOW (ref 36.0–46.0)
Hemoglobin: 7.1 g/dL — ABNORMAL LOW (ref 12.0–15.0)
Immature Granulocytes: 1 %
Lymphocytes Relative: 87 %
Lymphs Abs: 97 10*3/uL — ABNORMAL HIGH (ref 0.7–4.0)
MCH: 30.3 pg (ref 26.0–34.0)
MCHC: 27.5 g/dL — ABNORMAL LOW (ref 30.0–36.0)
MCV: 110.3 fL — ABNORMAL HIGH (ref 80.0–100.0)
Monocytes Absolute: 9.4 10*3/uL — ABNORMAL HIGH (ref 0.1–1.0)
Monocytes Relative: 9 %
Neutro Abs: 2.1 10*3/uL (ref 1.7–7.7)
Neutrophils Relative %: 2 %
Platelets: 20 10*3/uL — CL (ref 150–400)
RBC: 2.34 MIL/uL — ABNORMAL LOW (ref 3.87–5.11)
RDW: 22.2 % — ABNORMAL HIGH (ref 11.5–15.5)
WBC Morphology: ABNORMAL
WBC: 109.8 10*3/uL (ref 4.0–10.5)
nRBC: 0.2 % (ref 0.0–0.2)

## 2019-05-29 LAB — BASIC METABOLIC PANEL
Anion gap: 9 (ref 5–15)
BUN: 27 mg/dL — ABNORMAL HIGH (ref 8–23)
CO2: 22 mmol/L (ref 22–32)
Calcium: 8.6 mg/dL — ABNORMAL LOW (ref 8.9–10.3)
Chloride: 119 mmol/L — ABNORMAL HIGH (ref 98–111)
Creatinine, Ser: 0.9 mg/dL (ref 0.44–1.00)
GFR calc Af Amer: >60 mL/min (ref >60)
GFR calc non Af Amer: 58 mL/min — ABNORMAL LOW (ref >60)
Glucose, Bld: 111 mg/dL — ABNORMAL HIGH (ref 70–99)
Potassium: 3.9 mmol/L (ref 3.5–5.1)
Sodium: 150 mmol/L — ABNORMAL HIGH (ref 135–145)

## 2019-05-29 LAB — URINE CULTURE: Culture: >=100000 — AB

## 2019-05-29 MED ORDER — DEXTROSE 5 % IV SOLN
INTRAVENOUS | Status: DC
  Administered 2019-05-29 (×2): via INTRAVENOUS

## 2019-05-29 NOTE — Care Management Important Message (Signed)
Important Message  Patient Details IM Letter given to Rhea Pink SW to present to the Patient Name: Parnell Elder MRN: [id number] Date of Birth: 1931/12/05   Medicare Important Message Given:  Yes     Kerin Salen 05/29/2019, 11:23 AM

## 2019-05-29 NOTE — Progress Notes (Signed)
I was pastorally present with Samantha Mata. She was alert and talking. No family present. I noticed she was not attentive to what I was saying. She was mumbling. However I was ministerially present and offered her space to talk. I did not understand what she was saying, but was present with her. I offered a silent prayer and blessings on her journey.  Palliative Care Chaplain Resident Fidel Levy [phone]

## 2019-05-29 NOTE — Progress Notes (Signed)
PROGRESS NOTE    Samantha Mata  [id number] DOB: 07-Feb-1932 DOA: 05/27/2019 PCP: Samantha Mata, No Pcp Per   Brief Narrative: Samantha Mata is 85 year old female with history of small B-cell lymphoma, hyperlipidemia, depression, dementia who was brought to the emergency department for the evaluation of generalized weakness.  She is from a skilled nursing facility.  Samantha Mata was unable to contribute anything to the history.  As per the report, she is currently getting treatment for her CLL at [facility].  Samantha Mata has a colostomy and bilateral nephrostomy tubes .  She has history of pelvic mass.  When she presented she was found to be hypotensive, febrile, tachycardic.  Sepsis was suspected.  UA was impressive for urinary tract infection.  Samantha Mata was started on broad-spectrum antibiotics. Palliative care also consulted for goals of care discussion and possible hospice consideration.  Assessment & Plan:   Principal Problem:   Febrile illness, acute Active Problems:   Chronic lymphocytic leukemia (CLL), B-cell (HCC)   Colostomy status (HCC)   Hydronephrosis with ureteropelvic junction (UPJ) obstruction   Acute lower UTI   Sepsis (HCC)   Protein-calorie malnutrition, severe   Pressure injury of skin  Suspected sepsis: Afebrile today.  Currently hemodynamically stable.  Continue current broad-spectrum antibiotics.  We will follow-up cultures.  Source of sepsis is unclear but could be urine. Lactic acid normal.  Covid test 19 test negative.  Chest x-ray showed  pleural effusion bilaterally but no pneumonia. Urine culture showed pansensitive ACINETOBACTER LWOFFII  And  KOCURIA SPECIES.  Continue current antibiotics  CLL/leukocytosis: Severe leukocytosis.  Follows with oncology at [facility] with Dr Humphrey Rolls.  Chemotherapy was stopped because she was found to be resistant.  She has history of colon cancer dx in 1992 and underwent colostomy for bowel obstruction in 1997.  Hydronephrosis with  ureteropelvic junction obstruction: Has history of pelvic mass.  She is status post percutaneous nephrostomy.  Nephrostomy draining clear urine this morning. Samantha Mata is also status post colostomy. CT abdomen/pelvis done on October 2020 showed Large tumor mass within pelvis 11.6 x 11.7 x 12.3 cm in size , bilateral distal ureteral obstruction with hydroureteronephrosis.  Findings were consistent with leukemia or lymphoma.  Renal insufficiency: Creatinine of 1.27.  BUN of 40 on presentation.Improved with IV fluids.  We will continue to monitor  Altered mental status/ dementia: Samantha Mata is currently confused.   Current confusion could be due to acute metabolic encephalopathy from sepsis.  As per the son, she does not have a clear diagnosis of dementia but she is increasingly confused since last few weeks. She looks more alert and awake today.  She communicates  Macrocytic anemia: Normal iron, vitamin 123456, folic acid.  We will continue to monitor CBC.  Debility/deconditioning/multiple comorbidities/advanced age: We have requested for palliative care evaluation.  Samantha Mata is DNR.  Looks like she is a hospice candidate.She is from skilled nursing facility   Nutrition Problem: Severe Malnutrition Etiology: chronic illness(small B-cell lymphoma, dementia)      DVT prophylaxis: SCD Code Status: DNR Family Communication: Called sonfor update on 11/19.20 Disposition Plan: Hospice vs SNF   Consultants: Palliative care  Procedures: None  Antimicrobials:  Anti-infectives (From admission, onward)   Start     Dose/Rate Route Frequency Ordered Stop   05/29/19 0300  vancomycin (VANCOCIN) 500 mg in sodium chloride 0.9 % 100 mL IVPB     500 mg 100 mL/hr over 60 Minutes Intravenous Every 36 hours 05/27/19 2053     05/28/19 1200  ceFEPIme (MAXIPIME)  2 g in sodium chloride 0.9 % 100 mL IVPB     2 g 200 mL/hr over 30 Minutes Intravenous Every 24 hours 05/27/19 2058     05/28/19 1000  valACYclovir  (VALTREX) tablet 500 mg     500 mg Oral Daily 05/27/19 1955     05/27/19 1215  vancomycin (VANCOCIN) IVPB 1000 mg/200 mL premix     1,000 mg 200 mL/hr over 60 Minutes Intravenous STAT 05/27/19 1210 05/27/19 1614   05/27/19 1215  ceFEPIme (MAXIPIME) 2 g in sodium chloride 0.9 % 100 mL IVPB     2 g 200 mL/hr over 30 Minutes Intravenous STAT 05/27/19 1210 05/27/19 1350   05/27/19 1215  metroNIDAZOLE (FLAGYL) IVPB 500 mg     500 mg 100 mL/hr over 60 Minutes Intravenous STAT 05/27/19 1210 05/27/19 1515      Subjective:  Samantha Mata seen and examined the bedside this morning.  Looks much better today.  More alert and awake and she communicates but still confused.  She was coughing but was not in acute respite distress.     Objective: Vitals:   05/28/19 2131 05/29/19 0053 05/29/19 0531 05/29/19 0823  BP: (!) 114/59  (!) 117/59   Pulse: 89  87   Resp: 18  18   Temp: 98.2 F (36.8 C) 98.8 F (37.1 C) 97.8 F (36.6 C)   TempSrc: Oral Axillary Oral   SpO2: 91%  92% 93%  Weight:      Height:        Intake/Output Summary (Last 24 hours) at 05/29/2019 1137 Last data filed at 05/29/2019 0207 Gross per 24 hour  Intake 944.33 ml  Output 625 ml  Net 319.33 ml   Filed Weights   05/27/19 1042 05/28/19 0430  Weight: 40.8 kg 40.8 kg    Examination:  General exam: Extremely debilitated, deconditioned, chronically ill looking, generalized weakness  HEENT:PERRL, Ear/Nose normal on gross exam Respiratory system: Bilateral decreased air entry on the bases Cardiovascular system: S1 & S2 heard, RRR. No JVD, murmurs, rubs, gallops or clicks. No pedal edema. Gastrointestinal system: Abdomen is nondistended, soft and nontender. Colostomy with dark stool, bilateral nephrostomy tubes draining clear urine.   Central nervous system: Alert and awake but not oriented  Extremities: No edema, no clubbing ,no cyanosis Skin: No rashes, lesions or ulcers,no icterus ,no pallor     Data Reviewed: I have  personally reviewed following labs and imaging studies  CBC: Recent Labs  Lab 05/27/19 1109 05/28/19 0530 05/29/19 0539  WBC 184.2* 126.4* 109.8*  NEUTROABS 2.5  --  2.1  HGB 9.0* 7.6* 7.1*  HCT 31.5* 27.0* 25.8*  MCV 107.5* 108.9* 110.3*  PLT 26* 21* 20*   Basic Metabolic Panel: Recent Labs  Lab 05/27/19 1109 05/28/19 0530 05/29/19 0539  NA 145 146* 150*  K 5.1 4.1 3.9  CL 110 113* 119*  CO2 23 22 22   GLUCOSE 135* 109* 111*  BUN 40* 34* 27*  CREATININE 1.27* 0.91 0.90  CALCIUM 9.2 8.5* 8.6*   GFR: Estimated Creatinine Clearance: 29.4 mL/min (by C-G formula based on SCr of 0.9 mg/dL). Liver Function Tests: Recent Labs  Lab 05/27/19 1109 05/28/19 0530  AST 46* 36  ALT 23 19  ALKPHOS 84 64  BILITOT 0.6 0.8  PROT 6.4* 5.4*  ALBUMIN 3.2* 2.7*   No results for input(s): LIPASE, AMYLASE in the last 168 hours. No results for input(s): AMMONIA in the last 168 hours. Coagulation Profile: No results for input(s): INR, PROTIME  in the last 168 hours. Cardiac Enzymes: No results for input(s): CKTOTAL, CKMB, CKMBINDEX, TROPONINI in the last 168 hours. BNP (last 3 results) No results for input(s): PROBNP in the last 8760 hours. HbA1C: No results for input(s): HGBA1C in the last 72 hours. CBG: No results for input(s): GLUCAP in the last 168 hours. Lipid Profile: No results for input(s): CHOL, HDL, LDLCALC, TRIG, CHOLHDL, LDLDIRECT in the last 72 hours. Thyroid Function Tests: No results for input(s): TSH, T4TOTAL, FREET4, T3FREE, THYROIDAB in the last 72 hours. Anemia Panel: Recent Labs    05/28/19 1455  VITAMINB12 552  FOLATE 46.0  FERRITIN 2,093*  TIBC 142*  IRON 57   Sepsis Labs: Recent Labs  Lab 05/27/19 1109  LATICACIDVEN 1.0    Recent Results (from the past 240 hour(s))  Urine culture     Status: Abnormal   Collection Time: 05/27/19 10:50 AM   Specimen: In/Out Cath Urine  Result Value Ref Range Status   Specimen Description   Final    IN/OUT  CATH URINE LT SIDE Performed at [facility], [place] [address] [place], [place] [address]    Special Requests   Final    NONE Performed at [facility], [place] [address] [place], [place] [address]    Culture (A)  Final    >=100,000 COLONIES/mL ACINETOBACTER LWOFFII >=100,000 COLONIES/mL KOCURIA SPECIES Standardized susceptibility testing for this organism is not available. Performed at [facility], [place] [address], [place], [place] [address]    Report Status 05/29/2019 FINAL  Final   Organism ID, Bacteria ACINETOBACTER LWOFFII (A)  Final      Susceptibility   Acinetobacter lwoffii - MIC*    CEFTAZIDIME 8 SENSITIVE Sensitive     CEFTRIAXONE 16 INTERMEDIATE Intermediate     CIPROFLOXACIN <=0.25 SENSITIVE Sensitive     GENTAMICIN <=1 SENSITIVE Sensitive     IMIPENEM <=0.25 SENSITIVE Sensitive     PIP/TAZO 16 SENSITIVE Sensitive     TRIMETH/SULFA <=20 SENSITIVE Sensitive     CEFEPIME 2 SENSITIVE Sensitive     AMPICILLIN/SULBACTAM <=2 SENSITIVE Sensitive     * >=100,000 COLONIES/mL ACINETOBACTER LWOFFII  SARS CORONAVIRUS 2 (TAT 6-24 HRS) Nasopharyngeal Nasopharyngeal Swab     Status: None   Collection Time: 05/27/19 10:53 AM   Specimen: Nasopharyngeal Swab  Result Value Ref Range Status   SARS Coronavirus 2 NEGATIVE NEGATIVE Final    Comment: (NOTE) SARS-CoV-2 target nucleic acids are NOT DETECTED. The SARS-CoV-2 RNA is generally detectable in upper and lower respiratory specimens during the acute phase of infection. Negative results do not preclude SARS-CoV-2 infection, do not rule out co-infections with other pathogens, and should not be used as the sole basis for treatment or other Samantha Mata management decisions. Negative results must be combined with clinical observations, Samantha Mata history, and epidemiological information. The expected result is Negative. Fact Sheet for Patients: SugarRoll.be Fact Sheet for  Healthcare Providers: https://www.woods-mathews.com/ This test is not yet approved or cleared by the [place] FDA and  has been authorized for detection and/or diagnosis of SARS-CoV-2 by FDA under an Emergency Use Authorization (EUA). This EUA will remain  in effect (meaning this test can be used) for the duration of the COVID-19 declaration under Section 56 4(b)(1) of the Act, 21 U.S.C. section 360bbb-3(b)(1), unless the authorization is terminated or revoked sooner. Performed at [facility], [place] [address] [place],  [address]   Blood Culture (routine x 2)     Status: None (  Preliminary result)   Collection Time: 05/27/19 10:55 AM   Specimen: BLOOD  Result Value Ref Range Status   Specimen Description   Final    BLOOD RIGHT ANTECUBITAL Performed at [place] [address] [place], [place] [address]    Special Requests   Final    BOTTLES DRAWN AEROBIC AND ANAEROBIC Blood Culture adequate volume Performed at [place] [address] [place], [place] [address]    Culture   Final    NO GROWTH 2 DAYS Performed at [place] [address] [place], [place] [address]    Report Status PENDING  Incomplete  Blood Culture (routine x 2)     Status: None (Preliminary result)   Collection Time: 05/27/19 11:09 AM   Specimen: BLOOD  Result Value Ref Range Status   Specimen Description   Final    BLOOD LEFT ANTECUBITAL Performed at [place] [address] [place], [place] [address]    Special Requests   Final    BOTTLES DRAWN AEROBIC AND ANAEROBIC Blood Culture adequate volume Performed at [place] [address] [place], [place] [address]    Culture   Final    NO GROWTH 2 DAYS Performed at [place] [address] [place], [place] [address]    Report Status PENDING  Incomplete  MRSA PCR Screening     Status: None   Collection Time:  05/28/19  5:00 AM   Specimen: Nasal Mucosa; Nasopharyngeal  Result Value Ref Range Status   MRSA by PCR NEGATIVE NEGATIVE Final    Comment:        The GeneXpert MRSA Assay (FDA approved for NASAL specimens only), is one component of a comprehensive MRSA colonization surveillance program. It is not intended to diagnose MRSA infection nor to guide or monitor treatment for MRSA infections. Performed at [facility], [place] [address] [place], [place] [address]          Radiology Studies: No results found.      Scheduled Meds: . budesonide (PULMICORT) nebulizer solution  0.25 mg Nebulization BID  . cholecalciferol  2,000 Units Oral Daily  . feeding supplement (ENSURE ENLIVE)  237 mL Oral BID BM  . ferrous sulfate  325 mg Oral BID WC  . magnesium oxide  400 mg Oral Daily  . multivitamin with minerals  1 tablet Oral Daily  . traZODone  50 mg Oral QHS  . valACYclovir  500 mg Oral Daily   Continuous Infusions: . ceFEPime (MAXIPIME) IV 2 g (05/28/19 1200)  . dextrose 75 mL/hr at 05/29/19 0858  . vancomycin 500 mg (05/29/19 0227)     LOS: 2 days    Time spent: 35 mins,More than 50% of that time was spent in counseling and/or coordination of care.      Shelly Coss, MD [facility] Pager [phone]  If 7PM-7AM, please contact night-coverage [email or web address] Password [facility] 05/29/2019, 11:37 AM

## 2019-05-29 NOTE — Evaluation (Signed)
Occupational Therapy Evaluation Patient Details Name: Samantha Mata MRN: [id number] DOB: Aug 06, 1931 Today's Date: 05/29/2019    History of Present Illness 85 y.o. female with history of small B-cell lymphoma, hyperlipidemia, depression, dementia, was brought in hospital for evaluation of generalized weakness, fever, hypotension. Dx of possible sepsis.   Clinical Impression   Pt admitted with the above diagnosis and demonstrates the below listed deficits.  She currently requires total A for ADLs.  She resisted attempts to move toward EOB.  She is unable to provide info re: PLOF.  She was admitted from [facility].  [facility] in attempts to determine PLOF, however, no one answered at pt's nursing station.   Chart review indicates pt. Was ambulatory with RW 9/20, but has had a progressive decline in function.  At this time, no acute OT needs identified.  Will defer further OT to SNF.  OT will sign off at this time.     Follow Up Recommendations  SNF;Supervision/Assistance - 24 hour    Equipment Recommendations  None recommended by OT    Recommendations for Other Services       Precautions / Restrictions Precautions Precautions: Fall Precaution Comments: colostomy, B nephrostomy drains      Mobility Bed Mobility               General bed mobility comments: attempted to move pt to EOB, however, she actively resists attempts to move to EOB   Transfers                 General transfer comment: unable to safely attempt     Balance                                           ADL either performed or assessed with clinical judgement   ADL Overall ADL's : Needs assistance/impaired Eating/Feeding: Maximal assistance;Bed level   Grooming: Wash/dry hands;Wash/dry face;Oral care;Brushing hair;Total assistance;Bed level   Upper Body Bathing: Total assistance;Bed level   Lower Body Bathing: Total assistance;Bed level   Upper Body Dressing :  Total assistance;Bed level   Lower Body Dressing: Total assistance;Bed level   Toilet Transfer: Total assistance Toilet Transfer Details (indicate cue type and reason): unable to attempt safely  Toileting- Clothing Manipulation and Hygiene: Total assistance;Bed level       Functional mobility during ADLs: Total assistance       Vision         Perception     Praxis      Pertinent Vitals/Pain Pain Assessment: Faces Faces Pain Scale: No hurt     Hand Dominance     Extremity/Trunk Assessment Upper Extremity Assessment Upper Extremity Assessment: Generalized weakness(Pt moving bil. UEs spontaneously )   Lower Extremity Assessment Lower Extremity Assessment: Defer to PT evaluation   Cervical / Trunk Assessment Cervical / Trunk Assessment: Kyphotic   Communication Communication Communication: Receptive difficulties;Expressive difficulties   Cognition Arousal/Alertness: Awake/alert Behavior During Therapy: WFL for tasks assessed/performed Overall Cognitive Status: No family/caregiver present to determine baseline cognitive functioning                                 General Comments: Pt with h/o dementia.  No family present to provide info re: pt's baseline cognition.  Pt currently is unable to follow commands.  She will reach for  objects, and will partially wash her face when hand over hand assist provided, but she is unable to complete the task.     General Comments       Exercises     Shoulder Instructions      Home Living Family/patient expects to be discharged to:: Skilled nursing facility                                 Additional Comments: Pt from [facility]       Prior Functioning/Environment Level of Independence: Needs assistance        Comments: Pt unable to provide info re: PLOF.  [facility], where pt has been.  receptionist tranferred me to pt's nurse's station, but no one answered.  Chart was  reviewed, and it appears that pt was ambulatory with RW and living with family in 03/2019           OT Problem List: Decreased strength;Decreased activity tolerance;Impaired balance (sitting and/or standing);Decreased cognition;Decreased safety awareness      OT Treatment/Interventions:      OT Goals(Current goals can be found in the care plan section) Acute Rehab OT Goals OT Goal Formulation: Patient unable to participate in goal setting  OT Frequency:     Barriers to D/C:            Co-evaluation              AM-PAC OT "6 Clicks" Daily Activity     Outcome Measure Help from another person eating meals?: A Lot Help from another person taking care of personal grooming?: Total Help from another person toileting, which includes using toliet, bedpan, or urinal?: Total Help from another person bathing (including washing, rinsing, drying)?: Total Help from another person to put on and taking off regular upper body clothing?: Total Help from another person to put on and taking off regular lower body clothing?: Total 6 Click Score: 7   End of Session Equipment Utilized During Treatment: Oxygen  Activity Tolerance: Other (comment)(cognition ) Patient left: in bed;with call bell/phone within reach;with bed alarm set  OT Visit Diagnosis: Cognitive communication deficit (R41.841);Muscle weakness (generalized) (M62.81)                Time: [id number] OT Time Calculation (min): 11 min Charges:  OT General Charges $OT Visit: 1 Visit OT Evaluation $OT Eval Moderate Complexity: 1 Mod  Lucille Passy, OTR/L Acute Rehabilitation Services Pager [phone] Office [phone]   Lucille Passy M 05/29/2019, 2:58 PM

## 2019-05-29 NOTE — Progress Notes (Signed)
  Speech Language Pathology Treatment: Dysphagia  Patient Details Name: Samantha Mata MRN: [id number] DOB: 17-Sep-1931 Today's Date: 05/29/2019 Time: [id number] SLP Time Calculation (min) (ACUTE ONLY): 10 min  Assessment / Plan / Recommendation Clinical Impression  Pt seen at bedside for assessment of diet tolerance. Nurse tech reports pt has been eating well, without overt s/s aspiration on current diet (full liquid). No overt s/s aspiration observed following trials of ice cream with SLP at this time. Safe swallow precautions posted in pt room. Will continue to follow to assess diet tolerance and provide education.    HPI HPI: pt is an 85 yo female adm to [facility] with AMS and fever.  Pt has been at [facility]. She has h/o CLL, small cell B lymphoma, peripheral neuropathy, major depressive d/o, dementia, cancer with colostomy bag and concern for probable UTI. Swallow evaluation ordered.      SLP Plan  Continue with current plan of care       Recommendations  Diet recommendations: Other(comment)(full liquid) Liquids provided via: Cup Medication Administration: Crushed with puree Supervision: Full supervision/cueing for compensatory strategies Compensations: Slow rate;Small sips/bites;Minimize environmental distractions Postural Changes and/or Swallow Maneuvers: Seated upright 90 degrees;Upright 30-60 min after meal                Oral Care Recommendations: Oral care QID Follow up Recommendations: 24 hour supervision/assistance SLP Visit Diagnosis: Dysphagia, oral phase (R13.11);Dysphagia, pharyngoesophageal phase (R13.14) Plan: Continue with current plan of care       [place], [facility], [place] Pathologist Office: [phone] Pager: [phone]  Shonna Chock 05/29/2019, 10:27 AM

## 2019-05-29 NOTE — Consult Note (Signed)
Palliative care consult note  Reason for consult: Goals of care in light of CLL/B-cell lymphoma resistant to systemic therapy  Palliative care consult received.  Chart reviewed including personal review of pertinent labs and imaging.  Briefly, [name] is an 85 year old female with history of small B-cell lymphoma, hyperlipidemia, depression, dementia who was brought to the emergency department after her generalized weakness.  Chart review reveals that she is seen at [facility] for CLL.  She was recently discharged from [facility] to [facility] for rehab and was brought to [facility] following increase in weakness.  On presentation she was found to be hypotensive and febrile and sepsis was suspected.  UA was consistent with urinary tract infection.  She was started on broad-spectrum antibiotics and palliative consulted for goals of care.  I saw and examined Samantha Mata this afternoon.  She was awake and alert, but she is confused and cannot really participate in conversation.  I called and was able to reach her son, Ludwig Clarks.  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  Her family and caring for others always been the most important things to her.  Eddie reports understanding the severity of his mother's condition and the fact that she is not a candidate for further disease modifying therapy for her CLL.  He states the family is working to determine will be the best route to establish long-term care for her moving forward.  If she were a candidate for further rehab, he would want to maximize her functional status but realizes that she will likely need to be placed long-term in skilled facility at some point in the near future.  They have been working on trying to do the necessary work to arrange for this including applying for [employer].  Eddie reports it has been difficult for her mother as she is now basically  transfusion dependent on a weekly basis. We discussed clinical course as well as wishes moving forward in regard to advanced directives.  Concepts specific to code status and rehospitalization discussed.  We discussed difference between a aggressive medical intervention path and a palliative, comfort focused care path.    Concept of Hospice and Palliative Care were discussed.  -DNR/DNI -I placed information and his mother's bedside for family to review.  This included a copy of hard choices for loving people as well as information on hospice care at long-term care facilities.  He is certainly agreeable to additional information regarding hospice benefits and believes that his mother will benefit from this at some point in the future.  He will need to discuss further with the rest of his family on timing of election of her hospice benefits as she is still receiving transfusions on a regular basis. -Recommend palliative care to follow as an outpatient on discharge.  Please include this on the discharge summary if you agree is appropriate. -We will plan to follow-up with family again tomorrow.  Questions and concerns addressed.   PMT will continue to support holistically.  Time in: 1600 Time out: 1655 Total time: 55 minutes  Greater than 50%  of this time was spent counseling and coordinating care related to the above assessment and plan.  Micheline Rough, MD [facility] [phone]

## 2019-05-29 NOTE — TOC Benefit Eligibility Note (Signed)
Transition of Care [facility]) Benefit Eligibility Note    Patient Details  Name: Samantha Mata MRN: [id number] Date of Birth: 03/24/1932   Medication/Dose: Lantus 10 units daily  Covered?: Yes  Tier: 3 Drug  Prescription Coverage Preferred Pharmacy: local pharmacy  Spoke with Person/Company/Phone Number:: [employer] [phone]  Co-Pay: $8.95  Prior Approval: No(only needed if more than 2.0 for prior auth [id number])  Deductible: Met       Kerin Salen Phone Number: 05/29/2019, 11:49 AM

## 2019-05-30 LAB — BASIC METABOLIC PANEL
Anion gap: 7 (ref 5–15)
BUN: 20 mg/dL (ref 8–23)
CO2: 22 mmol/L (ref 22–32)
Calcium: 8.6 mg/dL — ABNORMAL LOW (ref 8.9–10.3)
Chloride: 118 mmol/L — ABNORMAL HIGH (ref 98–111)
Creatinine, Ser: 0.77 mg/dL (ref 0.44–1.00)
GFR calc Af Amer: >60 mL/min (ref >60)
GFR calc non Af Amer: >60 mL/min (ref >60)
Glucose, Bld: 108 mg/dL — ABNORMAL HIGH (ref 70–99)
Potassium: 3.6 mmol/L (ref 3.5–5.1)
Sodium: 147 mmol/L — ABNORMAL HIGH (ref 135–145)

## 2019-05-30 LAB — CBC WITH DIFFERENTIAL/PLATELET
Abs Immature Granulocytes: 0.44 10*3/uL — ABNORMAL HIGH (ref 0.00–0.07)
Basophils Absolute: 0.3 10*3/uL — ABNORMAL HIGH (ref 0.0–0.1)
Basophils Relative: 0 %
Eosinophils Absolute: 0.5 10*3/uL (ref 0.0–0.5)
Eosinophils Relative: 1 %
HCT: 23.8 % — ABNORMAL LOW (ref 36.0–46.0)
Hemoglobin: 6.8 g/dL — CL (ref 12.0–15.0)
Immature Granulocytes: 1 %
Lymphocytes Relative: 88 %
Lymphs Abs: 79.4 10*3/uL — ABNORMAL HIGH (ref 0.7–4.0)
MCH: 30.9 pg (ref 26.0–34.0)
MCHC: 28.6 g/dL — ABNORMAL LOW (ref 30.0–36.0)
MCV: 108.2 fL — ABNORMAL HIGH (ref 80.0–100.0)
Monocytes Absolute: 7.3 10*3/uL — ABNORMAL HIGH (ref 0.1–1.0)
Monocytes Relative: 8 %
Neutro Abs: 2.2 10*3/uL (ref 1.7–7.7)
Neutrophils Relative %: 2 %
Platelets: 18 10*3/uL — CL (ref 150–400)
RBC: 2.2 MIL/uL — ABNORMAL LOW (ref 3.87–5.11)
RDW: 21.8 % — ABNORMAL HIGH (ref 11.5–15.5)
WBC Morphology: ABNORMAL
WBC: 90.2 10*3/uL (ref 4.0–10.5)
nRBC: 0.2 % (ref 0.0–0.2)

## 2019-05-30 LAB — PREPARE RBC (CROSSMATCH)

## 2019-05-30 MED ORDER — SODIUM CHLORIDE 0.9 % IV SOLN
2.0000 g | Freq: Two times a day (BID) | INTRAVENOUS | Status: AC
  Administered 2019-05-30 – 2019-06-02 (×7): 2 g via INTRAVENOUS
  Filled 2019-05-30 (×7): qty 2

## 2019-05-30 MED ORDER — SODIUM CHLORIDE 0.45 % IV SOLN
INTRAVENOUS | Status: DC
  Administered 2019-05-30 – 2019-06-01 (×3): via INTRAVENOUS

## 2019-05-30 MED ORDER — SODIUM CHLORIDE 0.9% IV SOLUTION: Freq: Once | INTRAVENOUS | Status: DC

## 2019-05-30 NOTE — TOC Progression Note (Signed)
Transition of Care [facility]) - Progression Note    Patient Details  Name: Symaria Sindoni MRN: [id number] Date of Birth: 1931/08/31  Transition of Care [facility]) CM/SW [place], LCSW Phone Number: 05/30/2019, 4:03 PM  Clinical Narrative:   CSW received consult to assist family in obtaining information on having hospice follow patient in LTC facility. CSW made a referral to [facility] and requested that they follow up with patients son Ludwig Clarks. CSW stated to Eddie that a [facility] will contact him to answer any questions he may have about hospice program .     Expected Discharge Plan: [place] Barriers to Discharge: Continued Medical Work up  Expected Discharge Plan and Services Expected Discharge Plan: [place]   Discharge Planning Services: CM Consult Post Acute Care Choice: [place]                                         Social Determinants of Health (SDOH) Interventions    Readmission Risk Interventions No flowsheet data found.

## 2019-05-30 NOTE — Progress Notes (Signed)
CRITICAL VALUE ALERT  Critical Value:  Hemoglobin 6.8  Date & Time Notied:  05/30/19, [id number]  Provider Notified: Md Adhikari  Orders Received/Actions taken: Awaiting new orders   Critical Value:  WBC 90.2  Date & Time Notied:  05/30/19, [id number]  Provider Notified: MD Adhikari  Orders Received/Actions taken: Awaiting new orders   Critical Value:  Platelets 18  Date & Time Notied:  05/30/19, [id number]  Provider Notified: MD Adhikari  Orders Received/Actions taken: Awaiting new orders

## 2019-05-30 NOTE — Progress Notes (Signed)
During reassessment of patient, this nurse and student RN assisted pt to sit up and noted a malodorous smell coming from R nephrostomy. Upon lifting dressing to assess site, it was noted to have purulent drainage and stitching was no longer securing tube in place. When assessing L nephrostomy brown drainage was assessed, and  securement device was removed, as it was pulling on tube. L tube still held in place with stitching. Both sites redressed with split gauze. Patient also noted to have drainage from rectum. MD Adhikari made aware of above. Will continue to monitor patient.

## 2019-05-30 NOTE — Progress Notes (Signed)
PROGRESS NOTE    Samantha Mata  [id number] DOB: 1932-01-08 DOA: 05/27/2019 PCP: Patient, No Pcp Per   Brief Narrative: Patient is 85 year old female with history of small B-cell lymphoma, hyperlipidemia, depression, dementia who was brought to the emergency department for the evaluation of generalized weakness.  She is from a skilled nursing facility.  Patient was unable to contribute anything to the history.  As per the report, she is currently getting treatment for her CLL at [facility].  Patient has a colostomy and bilateral nephrostomy tubes .  She has history of pelvic mass.  When she presented she was found to be hypotensive, febrile, tachycardic.  Sepsis was suspected.  UA was impressive for urinary tract infection.  Patient was started on broad-spectrum antibiotics. Palliative care also consulted for goals of care discussion and possible hospice consideration.  Assessment & Plan:   Principal Problem:   Febrile illness, acute Active Problems:   Chronic lymphocytic leukemia (CLL), B-cell (HCC)   Colostomy status (HCC)   Hydronephrosis with ureteropelvic junction (UPJ) obstruction   Acute lower UTI   Sepsis (HCC)   Protein-calorie malnutrition, severe   Pressure injury of skin  Suspected sepsis present on admission: Afebrile today.  Relatively hypotensive today.    Source of sepsis is unclear but could be urine. Lactic acid normal.  Covid test 19 test negative.  Chest x-ray showed  pleural effusion bilaterally but no pneumonia. Urine culture showed pansensitive ACINETOBACTER LWOFFII  And  KOCURIA SPECIES.  Continue cefepime.  Will discontinue vancomycin.  Blood cultures have not shown any growth.  CLL/leukocytosis: Severe leukocytosis.  Follows with oncology at [facility] with Dr Humphrey Rolls.  Chemotherapy was stopped because she was found to be resistant.  She has history of colon cancer dx in 1992 and underwent colostomy for bowel obstruction in 1997.  Severe  thrombocytopenia: Due to CLL.Will transfuse her with 2 units  of platelets today  Hydronephrosis with ureteropelvic junction obstruction: Has history of pelvic mass.  She is status post percutaneous nephrostomy.  Nephrostomy draining clear urine this morning. Patient is also status post colostomy. CT abdomen/pelvis done on October 2020 showed Large tumor mass within pelvis 11.6 x 11.7 x 12.3 cm in size , bilateral distal ureteral obstruction with hydroureteronephrosis.  Findings were consistent with leukemia or lymphoma.  Renal insufficiency: Currently kidney function at baseline.  BUN of 40 on presentation.Improved with IV fluids.  We will continue to monitor  Altered mental status/ dementia: Patient is currently confused.   Current confusion could be due to acute metabolic encephalopathy from sepsis.  As per the son, she does not have a clear diagnosis of dementia but she is increasingly confused since last few weeks. She is currently more  alert and awake.  She communicates  Macrocytic anemia: Normal iron, vitamin [id number], folic acid.  We will continue to monitor CBC.  Debility/deconditioning/multiple comorbidities/advanced age: We have requested for palliative care evaluation.  Patient is DNR.  She is from skilled nursing facility.  Palliative care recommended outpatient palliative care follow-up   Nutrition Problem: Severe Malnutrition Etiology: chronic illness(small B-cell lymphoma, dementia)      DVT prophylaxis: SCD Code Status: DNR Family Communication: Called son for update on 11/19.20 Disposition Plan: Back to skilled nursing facility with palliative care follow-up when she becomes hemodynamically stable.   Consultants: Palliative care  Procedures: None  Antimicrobials:  Anti-infectives (From admission, onward)   Start     Dose/Rate Route Frequency Ordered Stop   05/29/19 0300  vancomycin (VANCOCIN) 500 mg in sodium chloride 0.9 % 100 mL IVPB  Status:  Discontinued     500  mg 100 mL/hr over 60 Minutes Intravenous Every 36 hours 05/27/19 2053 05/30/19 0911   05/28/19 1200  ceFEPIme (MAXIPIME) 2 g in sodium chloride 0.9 % 100 mL IVPB     2 g 200 mL/hr over 30 Minutes Intravenous Every 24 hours 05/27/19 2058     05/28/19 1000  valACYclovir (VALTREX) tablet 500 mg     500 mg Oral Daily 05/27/19 1955     05/27/19 1215  vancomycin (VANCOCIN) IVPB 1000 mg/200 mL premix     1,000 mg 200 mL/hr over 60 Minutes Intravenous STAT 05/27/19 1210 05/27/19 1614   05/27/19 1215  ceFEPIme (MAXIPIME) 2 g in sodium chloride 0.9 % 100 mL IVPB     2 g 200 mL/hr over 30 Minutes Intravenous STAT 05/27/19 1210 05/27/19 1350   05/27/19 1215  metroNIDAZOLE (FLAGYL) IVPB 500 mg     500 mg 100 mL/hr over 60 Minutes Intravenous STAT 05/27/19 1210 05/27/19 1515      Subjective:  Patient seen and examined at bedside this morning. Mildly hypotensive.  Afebrile.  Mental status continues to improve and she looks more comfortable, alert and awake.  She was not in any kind of distress today.   Objective: Vitals:   05/29/19 2247 05/30/19 0519 05/30/19 0751 05/30/19 0826  BP: (!) 99/54 (!) 95/54  (!) 93/55  Pulse: 96 86  84  Resp: 17 16  20   Temp: 98.7 F (37.1 C) 98.4 F (36.9 C)  98.7 F (37.1 C)  TempSrc: Oral Oral  Oral  SpO2: 95% 95% 94% 97%  Weight:  53.9 kg    Height:        Intake/Output Summary (Last 24 hours) at 05/30/2019 0911 Last data filed at 05/30/2019 0857 Gross per 24 hour  Intake 2438.7 ml  Output 650 ml  Net 1788.7 ml   Filed Weights   05/27/19 1042 05/28/19 0430 05/30/19 0519  Weight: 40.8 kg 40.8 kg 53.9 kg    Examination:  General exam: Extremely debilitated, deconditioned, chronically ill looking, generalized weakness  HEENT:PERRL, Ear/Nose normal on gross exam Respiratory system: Bilateral decreased air entry on the bases Cardiovascular system: S1 & S2 heard, RRR. No JVD, murmurs, rubs, gallops or clicks. No pedal edema. Gastrointestinal  system: Abdomen is nondistended, soft and nontender. Colostomy with dark stool, bilateral nephrostomy tubes draining clear urine.   Central nervous system: Alert and awake but not oriented  Extremities: No edema, no clubbing ,no cyanosis Skin: No rashes, lesions or ulcers,no icterus ,no pallor     Data Reviewed: I have personally reviewed following labs and imaging studies  CBC: Recent Labs  Lab 05/27/19 1109 05/28/19 0530 05/29/19 0539 05/30/19 0528  WBC 184.2* 126.4* 109.8* 90.2*  NEUTROABS 2.5  --  2.1 2.2  HGB 9.0* 7.6* 7.1* 6.8*  HCT 31.5* 27.0* 25.8* 23.8*  MCV 107.5* 108.9* 110.3* 108.2*  PLT 26* 21* 20* 18*   Basic Metabolic Panel: Recent Labs  Lab 05/27/19 1109 05/28/19 0530 05/29/19 0539 05/30/19 0528  NA 145 146* 150* 147*  K 5.1 4.1 3.9 3.6  CL 110 113* 119* 118*  CO2 23 22 22 22   GLUCOSE 135* 109* 111* 108*  BUN 40* 34* 27* 20  CREATININE 1.27* 0.91 0.90 0.77  CALCIUM 9.2 8.5* 8.6* 8.6*   GFR: Estimated Creatinine Clearance: 43.7 mL/min (by C-G formula based on SCr of 0.77 mg/dL). Liver  Function Tests: Recent Labs  Lab 05/27/19 1109 05/28/19 0530  AST 46* 36  ALT 23 19  ALKPHOS 84 64  BILITOT 0.6 0.8  PROT 6.4* 5.4*  ALBUMIN 3.2* 2.7*   No results for input(s): LIPASE, AMYLASE in the last 168 hours. No results for input(s): AMMONIA in the last 168 hours. Coagulation Profile: No results for input(s): INR, PROTIME in the last 168 hours. Cardiac Enzymes: No results for input(s): CKTOTAL, CKMB, CKMBINDEX, TROPONINI in the last 168 hours. BNP (last 3 results) No results for input(s): PROBNP in the last 8760 hours. HbA1C: No results for input(s): HGBA1C in the last 72 hours. CBG: No results for input(s): GLUCAP in the last 168 hours. Lipid Profile: No results for input(s): CHOL, HDL, LDLCALC, TRIG, CHOLHDL, LDLDIRECT in the last 72 hours. Thyroid Function Tests: No results for input(s): TSH, T4TOTAL, FREET4, T3FREE, THYROIDAB in the last 72  hours. Anemia Panel: Recent Labs    05/28/19 1455  VITAMINB12 552  FOLATE 46.0  FERRITIN 2,093*  TIBC 142*  IRON 57   Sepsis Labs: Recent Labs  Lab 05/27/19 1109  LATICACIDVEN 1.0    Recent Results (from the past 240 hour(s))  Urine culture     Status: Abnormal   Collection Time: 05/27/19 10:50 AM   Specimen: In/Out Cath Urine  Result Value Ref Range Status   Specimen Description   Final    IN/OUT CATH URINE LT SIDE Performed at [facility], [place] [address] [place], [place] [address]    Special Requests   Final    NONE Performed at [facility], [place] [address], [place], [place] [address]    Culture (A)  Final    >=100,000 COLONIES/mL ACINETOBACTER LWOFFII >=100,000 COLONIES/mL KOCURIA SPECIES Standardized susceptibility testing for this organism is not available. Performed at [facility], [place] [address], [place], [place] [address]    Report Status 05/29/2019 FINAL  Final   Organism ID, Bacteria ACINETOBACTER LWOFFII (A)  Final      Susceptibility   Acinetobacter lwoffii - MIC*    CEFTAZIDIME 8 SENSITIVE Sensitive     CEFTRIAXONE 16 INTERMEDIATE Intermediate     CIPROFLOXACIN <=0.25 SENSITIVE Sensitive     GENTAMICIN <=1 SENSITIVE Sensitive     IMIPENEM <=0.25 SENSITIVE Sensitive     PIP/TAZO 16 SENSITIVE Sensitive     TRIMETH/SULFA <=20 SENSITIVE Sensitive     CEFEPIME 2 SENSITIVE Sensitive     AMPICILLIN/SULBACTAM <=2 SENSITIVE Sensitive     * >=100,000 COLONIES/mL ACINETOBACTER LWOFFII  SARS CORONAVIRUS 2 (TAT 6-24 HRS) Nasopharyngeal Nasopharyngeal Swab     Status: None   Collection Time: 05/27/19 10:53 AM   Specimen: Nasopharyngeal Swab  Result Value Ref Range Status   SARS Coronavirus 2 NEGATIVE NEGATIVE Final    Comment: (NOTE) SARS-CoV-2 target nucleic acids are NOT DETECTED. The SARS-CoV-2 RNA is generally detectable in upper and lower respiratory specimens during the acute phase of infection.  Negative results do not preclude SARS-CoV-2 infection, do not rule out co-infections with other pathogens, and should not be used as the sole basis for treatment or other patient management decisions. Negative results must be combined with clinical observations, patient history, and epidemiological information. The expected result is Negative. Fact Sheet for Patients: SugarRoll.be Fact Sheet for Healthcare Providers: https://www.woods-mathews.com/ This test is not yet approved or cleared by the [place] FDA and  has been authorized for detection and/or diagnosis of SARS-CoV-2 by FDA under an Emergency Use Authorization (EUA).  This EUA will remain  in effect (meaning this test can be used) for the duration of the COVID-19 declaration under Section 56 4(b)(1) of the Act, 21 U.S.C. section 360bbb-3(b)(1), unless the authorization is terminated or revoked sooner. Performed at [facility], [place] [address], [place], [place] [address]   Blood Culture (routine x 2)     Status: None (Preliminary result)   Collection Time: 05/27/19 10:55 AM   Specimen: BLOOD  Result Value Ref Range Status   Specimen Description   Final    BLOOD RIGHT ANTECUBITAL Performed at [facility] [address], [place], [place] [address]    Special Requests   Final    BOTTLES DRAWN AEROBIC AND ANAEROBIC Blood Culture adequate volume Performed at [facility] [address], [place], [place] [address]    Culture   Final    NO GROWTH 3 DAYS Performed at [facility], [place] [address], [place], [place] [address]    Report Status PENDING  Incomplete  Blood Culture (routine x 2)     Status: None (Preliminary result)   Collection Time: 05/27/19 11:09 AM   Specimen: BLOOD  Result Value Ref Range Status   Specimen Description   Final    BLOOD LEFT ANTECUBITAL Performed at [facility] [address] [place], [place] [address]    Special Requests   Final    BOTTLES DRAWN AEROBIC AND ANAEROBIC Blood Culture adequate volume Performed at [facility] [address], [place], [place] [address]    Culture   Final    NO GROWTH 2 DAYS Performed at [facility] [address] [place], [place] [address]    Report Status PENDING  Incomplete  MRSA PCR Screening     Status: None   Collection Time: 05/28/19  5:00 AM   Specimen: Nasal Mucosa; Nasopharyngeal  Result Value Ref Range Status   MRSA by PCR NEGATIVE NEGATIVE Final    Comment:        The GeneXpert MRSA Assay (FDA approved for NASAL specimens only), is one component of a comprehensive MRSA colonization surveillance program. It is not intended to diagnose MRSA infection nor to guide or monitor treatment for MRSA infections. Performed at [facility], [place] [address], [place],  [address]          Radiology Studies: No results found.      Scheduled Meds:  sodium chloride   Intravenous Once   budesonide (PULMICORT) nebulizer solution  0.25 mg Nebulization BID   cholecalciferol  2,000 Units Oral Daily   feeding supplement (ENSURE ENLIVE)  237 mL Oral BID BM   ferrous sulfate  325 mg Oral BID WC   magnesium oxide  400 mg Oral Daily   multivitamin with minerals  1 tablet Oral Daily   traZODone  50 mg Oral QHS   valACYclovir  500 mg Oral Daily   Continuous Infusions:  sodium chloride     ceFEPime (MAXIPIME) IV 2 g (05/29/19 1304)     LOS: 3 days    Time spent: 35 mins,More than 50% of that time was spent in counseling and/or coordination of care.      Shelly Coss, MD [facility] Pager [phone]  If 7PM-7AM, please contact night-coverage [email or web address] Password [email or web address] 05/30/2019, 9:11 AM

## 2019-05-30 NOTE — Progress Notes (Signed)
Pharmacy Antibiotic Note  Samantha Mata is a 85 y.o. female admitted on 05/27/2019 with fever and confusion.  Pharmacy has been consulted for cefepime dosing.  Pt has PMH significant for small B cell lymphoma, HLD, dementia. Presenting with weakness, limited history available on admission. PT received initial doses of vancomycin, cefepime, and metronidazole in the ED.  Today, 05/30/19 WBC elevated 2nd CLL, ANC WNL, AF, relative hypotension per MD note B PCN tubes, + UCx   Plan:  Increase Cefepime to 2 g IV q12h  Dc vancomycin  Height: 5\' 6"  (167.6 cm) Weight: 118 lb 13.3 oz (53.9 kg) IBW/kg (Calculated) : 59.3  Temp (24hrs), Avg:98.3 F (36.8 C), Min:97.8 F (36.6 C), Max:98.7 F (37.1 C)  Recent Labs  Lab 05/27/19 1109 05/28/19 0530 05/29/19 0539 05/30/19 0528  WBC 184.2* 126.4* 109.8* 90.2*  CREATININE 1.27* 0.91 0.90 0.77  LATICACIDVEN 1.0  --   --   --     Estimated Creatinine Clearance: 43.7 mL/min (by C-G formula based on SCr of 0.77 mg/dL).    Allergies  Allergen Reactions  . Neosporin [Bacitracin-Polymyxin B]   Antimicrobials this admission: vancomycin 11/18 >> 11/21 cefepime 11/18 >>  Flagyl 11/18 x 1 dose Valtrex as PTA>> Dose adjustments this admission: 11/21 incr Cefepime to 2 gm q12 Microbiology results: 11/18 [id number] 11/18 UCx: > 100K acinetobacter Lwoffii - sens to all X CTX > 100K Kocuria species 11/18 SARS-2: Negative 11/19 MRSA PCR neg  Thank you for allowing pharmacy to be a part of this patient's care.  Eudelia Bunch, Pharm.D [phone] 05/30/2019 1:14 PM

## 2019-05-30 NOTE — Progress Notes (Signed)
Palliative care consult note  Reason for consult: Goals of care in light of CLL/B-cell lymphoma resistant to systemic therapy  I saw and examined Samantha Mata this afternoon.  She was awake and alert, but she is confused and cannot really participate in conversation.  I met at bedside with her son, Samantha Mata.  We reviewed her clinical course as well as pathways forward for his mother's care.  Eddie reports that family understands that time is short and family wants to work to determine best venue for care and if she will benefit from further rehab.  Overall, family has been working to determine options for LTC.    -DNR/DNI -I met today at the bedside with her son, Samantha Mata.  He is interested in discussing hospice services at [place] with [place] as she used to work there.  I gave him a copy of hard choices for loving people as well as information on hospice care at long-term care facilities.  Requests to speak with [facility] liaison regarding additional information regarding hospice benefits and believes that his mother will benefit from this at some point in the near future.  He will need to discuss further with the rest of his family on timing of election of her hospice benefits as she is still receiving transfusions on a regular basis. -Recommend palliative care or hospice to follow as an outpatient on discharge.  Please include this on the discharge summary if you agree is appropriate.  Questions and concerns addressed. PMT will continue to support holistically.  Total time: 25 minutes  Greater than 50% of this time was spent counseling and coordinating care related to the above assessment and plan.  Micheline Rough, MD [place] Team [phone]

## 2019-05-31 ENCOUNTER — Inpatient Hospital Stay (HOSPITAL_COMMUNITY): Payer: Medicare Other

## 2019-05-31 LAB — BASIC METABOLIC PANEL
Anion gap: 8 (ref 5–15)
BUN: 17 mg/dL (ref 8–23)
CO2: 22 mmol/L (ref 22–32)
Calcium: 8.6 mg/dL — ABNORMAL LOW (ref 8.9–10.3)
Chloride: 115 mmol/L — ABNORMAL HIGH (ref 98–111)
Creatinine, Ser: 0.71 mg/dL (ref 0.44–1.00)
GFR calc Af Amer: >60 mL/min (ref >60)
GFR calc non Af Amer: >60 mL/min (ref >60)
Glucose, Bld: 104 mg/dL — ABNORMAL HIGH (ref 70–99)
Potassium: 4 mmol/L (ref 3.5–5.1)
Sodium: 145 mmol/L (ref 135–145)

## 2019-05-31 LAB — BPAM RBC
Blood Product Expiration Date: 202012222359
Blood Product Expiration Date: 202012222359
ISSUE DATE / TIME: 202011211209
ISSUE DATE / TIME: 202011211539
Unit Type and Rh: 5100
Unit Type and Rh: 5100

## 2019-05-31 LAB — CBC WITH DIFFERENTIAL/PLATELET
Abs Immature Granulocytes: 0.65 10*3/uL — ABNORMAL HIGH (ref 0.00–0.07)
Basophils Absolute: 0.3 10*3/uL — ABNORMAL HIGH (ref 0.0–0.1)
Basophils Relative: 0 %
Eosinophils Absolute: 0.5 10*3/uL (ref 0.0–0.5)
Eosinophils Relative: 1 %
HCT: 34.4 % — ABNORMAL LOW (ref 36.0–46.0)
Hemoglobin: 10.7 g/dL — ABNORMAL LOW (ref 12.0–15.0)
Immature Granulocytes: 1 %
Lymphocytes Relative: 88 %
Lymphs Abs: 79.5 10*3/uL — ABNORMAL HIGH (ref 0.7–4.0)
MCH: 31 pg (ref 26.0–34.0)
MCHC: 31.1 g/dL (ref 30.0–36.0)
MCV: 99.7 fL (ref 80.0–100.0)
Monocytes Absolute: 6.7 10*3/uL — ABNORMAL HIGH (ref 0.1–1.0)
Monocytes Relative: 8 %
Neutro Abs: 2.1 10*3/uL (ref 1.7–7.7)
Neutrophils Relative %: 2 %
Platelets: 15 10*3/uL — CL (ref 150–400)
RBC: 3.45 MIL/uL — ABNORMAL LOW (ref 3.87–5.11)
RDW: 19.6 % — ABNORMAL HIGH (ref 11.5–15.5)
WBC Morphology: ABNORMAL
WBC: 89.6 10*3/uL (ref 4.0–10.5)
nRBC: 0.2 % (ref 0.0–0.2)

## 2019-05-31 LAB — TYPE AND SCREEN
ABO/RH(D): O POS
Antibody Screen: NEGATIVE
Unit division: 0
Unit division: 0

## 2019-05-31 LAB — ABO/RH: ABO/RH(D): O POS

## 2019-05-31 NOTE — Progress Notes (Signed)
PROGRESS NOTE    Samantha Mata  [id number] DOB: 03-23-1932 DOA: 05/27/2019 PCP: Patient, No Pcp Per   Brief Narrative: Patient is 85 year old female with history of small B-cell lymphoma, hyperlipidemia, depression, dementia who was brought to the emergency department for the evaluation of generalized weakness.  She is from a skilled nursing facility.  Patient was unable to contribute anything to the history.  As per the report, she is currently getting treatment for her CLL at [facility].  Patient has a colostomy and bilateral nephrostomy tubes .  She has history of pelvic mass.  When she presented she was found to be hypotensive, febrile, tachycardic.  Sepsis was suspected.  UA was impressive for urinary tract infection.  Patient was started on broad-spectrum antibiotics. Palliative care also consulted for goals of care discussion and possible hospice consideration.  Recommended follow-up with hospice at discussion facility. There is concern for dislodgment of the right nephrostomy tube.  I have consulted IR.  Assessment & Plan:   Principal Problem:   Febrile illness, acute Active Problems:   Chronic lymphocytic leukemia (CLL), B-cell (HCC)   Colostomy status (HCC)   Hydronephrosis with ureteropelvic junction (UPJ) obstruction   Acute lower UTI   Sepsis (HCC)   Protein-calorie malnutrition, severe   Pressure injury of skin  Suspected sepsis present on admission: Afebrile today.  Hemodynamically stable source of sepsis is unclear but could be urine. Lactic acid normal.  Covid test 19 test negative.  Chest x-ray showed  pleural effusion bilaterally but no pneumonia. Urine culture showed pansensitive ACINETOBACTER LWOFFII  And  KOCURIA SPECIES.  Continue cefepime. Blood cultures have not shown any growth. There is concern for turbid urine output from the right nephrostomy tube.  Will send for culture.  Continue cefepime for now  CLL/leukocytosis: Severe leukocytosis.   Follows with oncology at [facility] with Dr Humphrey Rolls.  Chemotherapy was stopped because she was found to be resistant.  She has history of colon cancer dx in 1992 and underwent colostomy for bowel obstruction in 1997.  Severe thrombocytopenia: Due to CLL.Will transfuse her with 2 units  of platelets today  Hydronephrosis with ureteropelvic junction obstruction: Has history of pelvic mass.  She is status post percutaneous nephrostomy.  Right-sided nephrostomy tube is almost dislodged and there is less urine output.  I have discussed with radiology who will help to reposition the nephrostomy tube. Patient is also status post colostomy. CT abdomen/pelvis done on October 2020 showed Large tumor mass within pelvis 11.6 x 11.7 x 12.3 cm in size , bilateral distal ureteral obstruction with hydroureteronephrosis.  Findings were consistent with leukemia or lymphoma.  Renal insufficiency: Currently kidney function at baseline.  BUN of 40 on presentation.Improved with IV fluids.  We will continue to monitor  Altered mental status/ dementia: Patient is currently confused.   Current confusion could be due to acute metabolic encephalopathy from sepsis.  As per the son, she does not have a clear diagnosis of dementia but she is increasingly confused since last few weeks. She is currently more  alert and awake.  She communicates  Macrocytic anemia: Normal iron, vitamin 123456, folic acid.  Hb dropped to the range of 6 and she was transfused with 2 units of PRBC.  Hemoglobin in the range of  10 today.  Debility/deconditioning/multiple comorbidities/advanced age: We have requested for palliative care evaluation.  Patient is DNR.  She is from skilled nursing facility.  Palliative care recommended  palliative care follow-up at [facility]  Nutrition Problem: Severe Malnutrition Etiology: chronic illness(small B-cell lymphoma, dementia)      DVT prophylaxis: SCD Code Status: DNR Family Communication: Called son for update on  05/31/19 Disposition Plan: Back to skilled nursing facility with palliative care follow-up after her nephrostomy tube is repositioned.   Consultants: Palliative care  Procedures: None  Antimicrobials:  Anti-infectives (From admission, onward)   Start     Dose/Rate Route Frequency Ordered Stop   05/30/19 2200  ceFEPIme (MAXIPIME) 2 g in sodium chloride 0.9 % 100 mL IVPB     2 g 200 mL/hr over 30 Minutes Intravenous Every 12 hours 05/30/19 1312     05/29/19 0300  vancomycin (VANCOCIN) 500 mg in sodium chloride 0.9 % 100 mL IVPB  Status:  Discontinued     500 mg 100 mL/hr over 60 Minutes Intravenous Every 36 hours 05/27/19 2053 05/30/19 0911   05/28/19 1200  ceFEPIme (MAXIPIME) 2 g in sodium chloride 0.9 % 100 mL IVPB  Status:  Discontinued     2 g 200 mL/hr over 30 Minutes Intravenous Every 24 hours 05/27/19 2058 05/30/19 1312   05/28/19 1000  valACYclovir (VALTREX) tablet 500 mg     500 mg Oral Daily 05/27/19 1955     05/27/19 1215  vancomycin (VANCOCIN) IVPB 1000 mg/200 mL premix     1,000 mg 200 mL/hr over 60 Minutes Intravenous STAT 05/27/19 1210 05/27/19 1614   05/27/19 1215  ceFEPIme (MAXIPIME) 2 g in sodium chloride 0.9 % 100 mL IVPB     2 g 200 mL/hr over 30 Minutes Intravenous STAT 05/27/19 1210 05/27/19 1350   05/27/19 1215  metroNIDAZOLE (FLAGYL) IVPB 500 mg     500 mg 100 mL/hr over 60 Minutes Intravenous STAT 05/27/19 1210 05/27/19 1515      Subjective:  Patient seen and examined the bedside this morning.  Hemodynamically stable.  There is concern for dislodgment of right-sided nephrostomy tube.  There is also a scant urine output than before.  Requested IR evaluation.  RN was also concerned about foul-smelling discharge from the rectum.  I do not want to pursue any further work-up for this due to her history of pelvic mass, advanced CLL.   Objective: Vitals:   05/31/19 0757 05/31/19 0845 05/31/19 0921 05/31/19 1108  BP:  115/61 (!) 107/53 (!) 105/58  Pulse:  85  90 83  Resp:  (!) 24 (!) 24 (!) 22  Temp:  98 F (36.7 C) 98.5 F (36.9 C) 98 F (36.7 C)  TempSrc:  Oral Axillary Oral  SpO2: 94% 96% 95% 96%  Weight:      Height:        Intake/Output Summary (Last 24 hours) at 05/31/2019 1219 Last data filed at 05/31/2019 1107 Gross per 24 hour  Intake 1204 ml  Output 680 ml  Net 524 ml   Filed Weights   05/28/19 0430 05/30/19 0519 05/31/19 0500  Weight: 40.8 kg 53.9 kg 54 kg    Examination:  General exam: Extremely debilitated, deconditioned, chronically ill looking, generalized weakness  HEENT:PERRL, Ear/Nose normal on gross exam Respiratory system: Bilateral decreased air entry on the bases Cardiovascular system: S1 & S2 heard, RRR. No JVD, murmurs, rubs, gallops or clicks. No pedal edema. Gastrointestinal system: Abdomen is nondistended, soft and nontender. Colostomy with dark stool, bilateral nephrostomy tubes.  Right tube almost dislodged with scant urine in the bag.  Left-sided tube draining clear urine.   Central nervous system: Alert and awake but not oriented  Extremities: No  edema, no clubbing ,no cyanosis Skin: No rashes, lesions or ulcers,no icterus ,no pallor    Data Reviewed: I have personally reviewed following labs and imaging studies  CBC: Recent Labs  Lab 05/27/19 1109 05/28/19 0530 05/29/19 0539 05/30/19 0528 05/31/19 0521  WBC 184.2* 126.4* 109.8* 90.2* 89.6*  NEUTROABS 2.5  --  2.1 2.2 2.1  HGB 9.0* 7.6* 7.1* 6.8* 10.7*  HCT 31.5* 27.0* 25.8* 23.8* 34.4*  MCV 107.5* 108.9* 110.3* 108.2* 99.7  PLT 26* 21* 20* 18* 15*   Basic Metabolic Panel: Recent Labs  Lab 05/27/19 1109 05/28/19 0530 05/29/19 0539 05/30/19 0528 05/31/19 0521  NA 145 146* 150* 147* 145  K 5.1 4.1 3.9 3.6 4.0  CL 110 113* 119* 118* 115*  CO2 23 22 22 22 22   GLUCOSE 135* 109* 111* 108* 104*  BUN 40* 34* 27* 20 17  CREATININE 1.27* 0.91 0.90 0.77 0.71  CALCIUM 9.2 8.5* 8.6* 8.6* 8.6*   GFR: Estimated Creatinine Clearance:  43.8 mL/min (by C-G formula based on SCr of 0.71 mg/dL). Liver Function Tests: Recent Labs  Lab 05/27/19 1109 05/28/19 0530  AST 46* 36  ALT 23 19  ALKPHOS 84 64  BILITOT 0.6 0.8  PROT 6.4* 5.4*  ALBUMIN 3.2* 2.7*   No results for input(s): LIPASE, AMYLASE in the last 168 hours. No results for input(s): AMMONIA in the last 168 hours. Coagulation Profile: No results for input(s): INR, PROTIME in the last 168 hours. Cardiac Enzymes: No results for input(s): CKTOTAL, CKMB, CKMBINDEX, TROPONINI in the last 168 hours. BNP (last 3 results) No results for input(s): PROBNP in the last 8760 hours. HbA1C: No results for input(s): HGBA1C in the last 72 hours. CBG: No results for input(s): GLUCAP in the last 168 hours. Lipid Profile: No results for input(s): CHOL, HDL, LDLCALC, TRIG, CHOLHDL, LDLDIRECT in the last 72 hours. Thyroid Function Tests: No results for input(s): TSH, T4TOTAL, FREET4, T3FREE, THYROIDAB in the last 72 hours. Anemia Panel: Recent Labs    05/28/19 1455  VITAMINB12 552  FOLATE 46.0  FERRITIN 2,093*  TIBC 142*  IRON 57   Sepsis Labs: Recent Labs  Lab 05/27/19 1109  LATICACIDVEN 1.0    Recent Results (from the past 240 hour(s))  Urine culture     Status: Abnormal   Collection Time: 05/27/19 10:50 AM   Specimen: In/Out Cath Urine  Result Value Ref Range Status   Specimen Description   Final    IN/OUT CATH URINE LT SIDE Performed at [facility], [place] [address] [place], [place] [address]    Special Requests   Final    NONE Performed at [facility], [place] [address], [place], [place] [address]    Culture (A)  Final    >=100,000 COLONIES/mL ACINETOBACTER LWOFFII >=100,000 COLONIES/mL KOCURIA SPECIES Standardized susceptibility testing for this organism is not available. Performed at [facility], [place] [address] [place], [place] [address]    Report Status 05/29/2019 FINAL  Final   Organism ID, Bacteria  ACINETOBACTER LWOFFII (A)  Final      Susceptibility   Acinetobacter lwoffii - MIC*    CEFTAZIDIME 8 SENSITIVE Sensitive     CEFTRIAXONE 16 INTERMEDIATE Intermediate     CIPROFLOXACIN <=0.25 SENSITIVE Sensitive     GENTAMICIN <=1 SENSITIVE Sensitive     IMIPENEM <=0.25 SENSITIVE Sensitive     PIP/TAZO 16 SENSITIVE Sensitive     TRIMETH/SULFA <=20 SENSITIVE Sensitive     CEFEPIME 2 SENSITIVE Sensitive  AMPICILLIN/SULBACTAM <=2 SENSITIVE Sensitive     * >=100,000 COLONIES/mL ACINETOBACTER LWOFFII  SARS CORONAVIRUS 2 (TAT 6-24 HRS) Nasopharyngeal Nasopharyngeal Swab     Status: None   Collection Time: 05/27/19 10:53 AM   Specimen: Nasopharyngeal Swab  Result Value Ref Range Status   SARS Coronavirus 2 NEGATIVE NEGATIVE Final    Comment: (NOTE) SARS-CoV-2 target nucleic acids are NOT DETECTED. The SARS-CoV-2 RNA is generally detectable in upper and lower respiratory specimens during the acute phase of infection. Negative results do not preclude SARS-CoV-2 infection, do not rule out co-infections with other pathogens, and should not be used as the sole basis for treatment or other patient management decisions. Negative results must be combined with clinical observations, patient history, and epidemiological information. The expected result is Negative. Fact Sheet for Patients: SugarRoll.be Fact Sheet for Healthcare Providers: https://www.woods-mathews.com/ This test is not yet approved or cleared by the [place] FDA and  has been authorized for detection and/or diagnosis of SARS-CoV-2 by FDA under an Emergency Use Authorization (EUA). This EUA will remain  in effect (meaning this test can be used) for the duration of the COVID-19 declaration under Section 56 4(b)(1) of the Act, 21 U.S.C. section 360bbb-3(b)(1), unless the authorization is terminated or revoked sooner. Performed at [facility], [place] [address], [place],  [place] [address]   Blood Culture (routine x 2)     Status: None (Preliminary result)   Collection Time: 05/27/19 10:55 AM   Specimen: BLOOD  Result Value Ref Range Status   Specimen Description   Final    BLOOD RIGHT ANTECUBITAL Performed at [facility] [address], [place], [place] [address]    Special Requests   Final    BOTTLES DRAWN AEROBIC AND ANAEROBIC Blood Culture adequate volume Performed at [facility] [address], [place], [place] [address]    Culture   Final    NO GROWTH 4 DAYS Performed at [facility], [place] [address], [place], [place] [address]    Report Status PENDING  Incomplete  Blood Culture (routine x 2)     Status: None (Preliminary result)   Collection Time: 05/27/19 11:09 AM   Specimen: BLOOD  Result Value Ref Range Status   Specimen Description   Final    BLOOD LEFT ANTECUBITAL Performed at [facility] [address], [place], [place] [address]    Special Requests   Final    BOTTLES DRAWN AEROBIC AND ANAEROBIC Blood Culture adequate volume Performed at [facility] [address] [place], [place] [address]    Culture   Final    NO GROWTH 4 DAYS Performed at [facility], [place] [address], [place], [place] [address]    Report Status PENDING  Incomplete  MRSA PCR Screening     Status: None   Collection Time: 05/28/19  5:00 AM   Specimen: Nasal Mucosa; Nasopharyngeal  Result Value Ref Range Status   MRSA by PCR NEGATIVE NEGATIVE Final    Comment:        The GeneXpert MRSA Assay (FDA approved for NASAL specimens only), is one component of a comprehensive MRSA colonization surveillance program. It is not intended to diagnose MRSA infection nor to guide or monitor treatment for MRSA infections. Performed at [facility], [place] [address], [place], [place] [address]          Radiology Studies: No results found.      Scheduled  Meds: . sodium chloride   Intravenous Once  . sodium chloride   Intravenous Once  . budesonide (PULMICORT) nebulizer solution  0.25 mg Nebulization BID  . cholecalciferol  2,000 Units Oral Daily  . feeding supplement (ENSURE ENLIVE)  237 mL Oral BID BM  . ferrous sulfate  325 mg Oral BID WC  . magnesium oxide  400 mg Oral Daily  . multivitamin with minerals  1 tablet Oral Daily  . traZODone  50 mg Oral QHS  . valACYclovir  500 mg Oral Daily   Continuous Infusions: . sodium chloride Stopped (05/31/19 0900)  . ceFEPime (MAXIPIME) IV 2 g (05/31/19 1144)     LOS: 4 days    Time spent: 35 mins,More than 50% of that time was spent in counseling and/or coordination of care.      Shelly Coss, MD [facility] Pager [phone]  If 7PM-7AM, please contact night-coverage [email or web address] Password [facility] 05/31/2019, 12:19 PM

## 2019-05-31 NOTE — Progress Notes (Signed)
    Palliative care consult note  Reason for consult: Goals of care in light of CLL/B-cell lymphoma resistant to systemic therapy  I saw and examined Ms. Hopwood this afternoon.  She was sleeping and receiving platelet transfusion.  I did not wake her as she is confused and cannot really participate in conversation.  Discussed with bedside care team as well as LCSW.  Chart reviewed including notes where hospice was able to reach son, Ludwig Clarks.  As noted, she would be eligible for hospice services but would need to give up further transfusions.  When I spoke with family yesterday, this was not something that they are agreeable to at this time.  Attempted to call her son, and left a voicemail requesting return call.   -DNR/DNI -Recommend palliative care to follow as an outpatient on discharge.  Please include this on the discharge summary if you agree is appropriate.  If family reaches a point where they are willing to forego further transfusions, she would certainly be eligible for hospice (including possibility of residential hospice) and well served by hospice care. -Attempted to call family to discuss further today.  I left a voicemail for her son Ludwig Clarks.  Questions and concerns addressed. PMT will continue to support holistically.  Total time: 20 minutes  Greater than 50% of this time was spent counseling and coordinating care related to the above assessment and plan.  Micheline Rough, MD [place] Team [phone]

## 2019-05-31 NOTE — Consult Note (Signed)
Chief Complaint: Patient was seen in consultation today for ureteral obstruction, pelvic mass  Referring Physician(s): Dr. Tawanna Solo  Supervising Physician: Daryll Brod  Patient Status: [facility] - In-pt  History of Present Illness: Samantha Mata is a 85 y.o. female with history of small B-cell lymphoma, HLD, depression, dementia, pelvic mass with colostomy and bilateral nephrostomy tubes placed at [facility] in October 2020 who was admitted from SNF due to generalized weakness.  Patient with advanced disease, currently undergoing platelet transfusions.  Family wishes to continue with level of care at this time.   IR consulted for nephrostomy tube displacement.  Patient with decreased UOP from right nephrostomy and concern for retraction.   PA to bedside to assess. Patient is confused.  Right nephrostomy has been pulled back.  Suture no longer in place.  No stat lock. Small amount of yellow urine in collection bag. Output decreased per RN. Insertion site is eroded.  Left nephrostomy tube in place.  No stat lock. Appears to be in place with good UOP, although some erosion of the insertion site is noted.   Case reviewed by Dr. Annamaria Boots who has discussed with ordering MD.  Plan for possible bilateral nephrostomy tube exchange in IR early this week.     Past Medical History:  Diagnosis Date  . Anemia   . Arthritis   . Cancer ([place])   . Dementia ([place])   . Hx of pulmonary embolus   . Hyperlipidemia   . Major depressive disorder   . Peripheral neuropathy   . Small cell B-cell lymphoma [facility])     Past Surgical History:  Procedure Laterality Date  . ABDOMINAL HYSTERECTOMY    . APPENDECTOMY    . Bilateral Nephrostomy Tube Placement    . COLON SURGERY  1960's   colostomy    Allergies: Neosporin [bacitracin-polymyxin b]  Medications: Prior to Admission medications   Medication Sig Start Date End Date Taking? Authorizing Provider  acetaminophen (TYLENOL) 500 MG tablet Take 1,000 mg by  mouth every 8 (eight) hours as needed for moderate pain.   Yes [provider]  albuterol (VENTOLIN HFA) 108 (90 Base) MCG/ACT inhaler Inhale 2 puffs into the lungs every 6 (six) hours as needed for wheezing or shortness of breath.   Yes [provider]  allopurinol (ZYLOPRIM) 300 MG tablet Take 300 mg by mouth daily.   Yes [provider]  atorvastatin (LIPITOR) 10 MG tablet Take 10 mg by mouth at bedtime.   Yes [provider]  cholecalciferol (VITAMIN D) 25 MCG (1000 UT) tablet Take 2,000 Units by mouth daily.   Yes [provider]  Fe Fum-FePoly-FA-Vit C-Vit B3 (INTEGRA F) 125-1 MG CAPS Take 1 tablet by mouth daily.   Yes [provider]  Fluticasone Furoate (ARNUITY ELLIPTA) 100 MCG/ACT AEPB Inhale 1 puff into the lungs daily.   Yes [provider]  gabapentin (NEURONTIN) 300 MG capsule Take 300 mg by mouth 2 (two) times daily.   Yes [provider]  magnesium oxide (MAG-OX) 400 MG tablet Take 400 mg by mouth daily.   Yes [provider]  Multiple Vitamin (MULTIVITAMIN) tablet Take 1 tablet by mouth daily.   Yes [provider]  ondansetron (ZOFRAN) 4 MG tablet Take 4 mg by mouth 2 (two) times daily as needed for nausea or vomiting.   Yes [provider]  potassium chloride (KLOR-CON) 10 MEQ tablet Take 10 mEq by mouth 2 (two) times daily.   Yes [provider]  sertraline (  ZOLOFT) 100 MG tablet Take 100 mg by mouth daily.   Yes [provider]  traZODone (DESYREL) 50 MG tablet Take 50 mg by mouth at bedtime.   Yes [provider]  valACYclovir (VALTREX) 500 MG tablet Take 500 mg by mouth daily.   Yes [provider]     Family History  Family history unknown: Yes    Social History   Socioeconomic History  . Marital status: Widowed    Spouse name: Not on file  . Number of children: Not on file  . Years of education: Not on file  . Highest education  level: Not on file  Occupational History  . Not on file  Social Needs  . Financial resource strain: Not on file  . Food insecurity    Worry: Not on file    Inability: Not on file  . Transportation needs    Medical: Not on file    Non-medical: Not on file  Tobacco Use  . Smoking status: Never Smoker  . Smokeless tobacco: Former Systems developer    Types: Snuff  Substance and Sexual Activity  . Alcohol use: Never    Frequency: Never  . Drug use: Never  . Sexual activity: Not on file  Lifestyle  . Physical activity    Days per week: Not on file    Minutes per session: Not on file  . Stress: Not on file  Relationships  . Social Herbalist on phone: Not on file    Gets together: Not on file    Attends religious service: Not on file    Active member of club or organization: Not on file    Attends meetings of clubs or organizations: Not on file    Relationship status: Not on file  Other Topics Concern  . Not on file  Social History Narrative  . Not on file     Review of Systems: A 12 point ROS discussed and pertinent positives are indicated in the HPI above.  All other systems are negative.  Review of Systems  Unable to perform ROS: Mental status change    Vital Signs: BP (!) 111/56   Pulse 90   Temp 97.8 F (36.6 C) (Oral)   Resp (!) 24   Ht 5\' 6"  (1.676 m)   Wt 119 lb 0.8 oz (54 kg)   SpO2 96%   BMI 19.21 kg/m   Physical Exam Vitals signs and nursing note reviewed.  Constitutional:      General: She is not in acute distress.    Appearance: She is not ill-appearing.  Cardiovascular:     Rate and Rhythm: Normal rate and regular rhythm.     Pulses: Normal pulses.  Pulmonary:     Effort: Pulmonary effort is normal. No respiratory distress.  Abdominal:     General: Abdomen is flat.     Palpations: Abdomen is soft.  Musculoskeletal: Normal range of motion.  Skin:    General: Skin is warm and dry.     Comments: R nephrostomy tube in place.  Insertion site  eroded.  Tube retracted.  Small amount of urin in collection bag-- output decreased per RN.   L nephrostomy tube in place.  Insertion site overall intact, although there is some erosion around the catheter.  No stat lock in place. + UOP in collection bag.   Neurological:     Mental Status: She is alert. She is disoriented.      MD Evaluation Airway:  WNL Heart: WNL Abdomen: WNL Chest/ Lungs: WNL ASA  Classification: 3 Mallampati/Airway Score: One   Imaging: Dg Chest 1 View  Result Date: 05/31/2019 CLINICAL DATA:  Shortness of breath EXAM: CHEST  1 VIEW COMPARISON:  05/27/2019 FINDINGS: Left-sided chest port terminates within the SVC. Stable cardiomediastinal contours. Persistent bilateral pleural effusions, right slightly greater than left with associated hazy bibasilar opacities. No pneumothorax. IMPRESSION: No significant change in bilateral pleural effusions, right greater than left with associated hazy bibasilar opacities. Electronically Signed   By: Davina Poke M.D.   On: 05/31/2019 13:30   Dg Chest Port 1 View  Result Date: 05/27/2019 CLINICAL DATA:  Altered mental status. History of chronic lymphocytic leukemia. EXAM: PORTABLE CHEST 1 VIEW COMPARISON:  April 10, 2019. FINDINGS: Port-A-Cath tip is in the superior vena cava. No pneumothorax. There are pleural effusions bilaterally. There is no appreciable edema or consolidation. Heart size and pulmonary vascularity normal. No adenopathy. There is aortic atherosclerosis. Bones are osteoporotic.  No focal bone lesions are evident. IMPRESSION: Pleural effusions bilaterally without demonstrable edema or consolidation. Heart size normal. No adenopathy demonstrable by radiography. Port-A-Cath tip in superior vena cava. Bones osteoporotic. Aortic Atherosclerosis (ICD10-I70.0). Electronically Signed   By: Lowella Grip III M.D.   On: 05/27/2019 11:15    Labs:  CBC: Recent Labs    05/28/19 0530 05/29/19 0539 05/30/19 0528  05/31/19 0521  WBC 126.4* 109.8* 90.2* 89.6*  HGB 7.6* 7.1* 6.8* 10.7*  HCT 27.0* 25.8* 23.8* 34.4*  PLT 21* 20* 18* 15*    COAGS: No results for input(s): INR, APTT in the last 8760 hours.  BMP: Recent Labs    05/28/19 0530 05/29/19 0539 05/30/19 0528 05/31/19 0521  NA 146* 150* 147* 145  K 4.1 3.9 3.6 4.0  CL 113* 119* 118* 115*  CO2 22 22 22 22   GLUCOSE 109* 111* 108* 104*  BUN 34* 27* 20 17  CALCIUM 8.5* 8.6* 8.6* 8.6*  CREATININE 0.91 0.90 0.77 0.71  GFRNONAA 58* 58* >60 >60  GFRAA >60 >60 >60 >60    LIVER FUNCTION TESTS: Recent Labs    05/27/19 1109 05/28/19 0530  BILITOT 0.6 0.8  AST 46* 36  ALT 23 19  ALKPHOS 84 64  PROT 6.4* 5.4*  ALBUMIN 3.2* 2.7*    TUMOR MARKERS: No results for input(s): AFPTM, CEA, CA199, CHROMGRNA in the last 8760 hours.  Assessment and Plan: Ureteral/bladder obstruction s/p bilateral nephrostomy tube placements at [facility] in October 2020 Concern for malposition of tubes PA to bedside to assess.  R tube does appear to be retracted with decreased UOP.  L PCN in place.  Some erosion around skin site to both catheter sites.  Discussed with Dr. Annamaria Boots as well as son, Samantha Mata.  Plan for nephrostogram with possible exchange of catheters based on fluoro findings.  Question whether patient will need sedation in order to accomplish this based on her confusion.  She is cooperative.   Will make NPO p MN.   Risks and benefits of PCN replacement was discussed with the patient including, but not limited to, infection, bleeding, significant bleeding causing loss or decrease in renal function or damage to adjacent structures.   All of the patient's questions were answered, patient is agreeable to proceed.  Consent signed and in chart.  Thank you for this interesting consult.  I greatly enjoyed meeting Samantha Mata and look forward to participating in their care.  A copy of this report was sent to the requesting provider on  this date.   Electronically Signed: Docia Barrier, PA 05/31/2019, 4:35 PM   I spent a total of 40 Minutes    in face to face in clinical consultation, greater than 50% of which was counseling/coordinating care for ureteral obstruction, pelvic mass, bilateral nephrostomy tubes.

## 2019-05-31 NOTE — Progress Notes (Signed)
Samantha Mata was experiencing an episode of coughing. The cough was non productive but concerning as an aspiration risk. At that time she had not been receiving anything PO. Bed was elevated but she continued with intermittent cough throughout the night.  Upon bath and peri care, purulent drainage began to ooze from her rectum when she coughed. It was concerning enough to call two NT's  for assistance. Bed pads were changed two times throughout the night.

## 2019-05-31 NOTE — TOC Progression Note (Signed)
Transition of Care [facility]) - Progression Note    Patient Details  Name: Samantha Mata MRN: [id number] Date of Birth: 09/19/1931  Transition of Care [facility]) CM/SW [place], LCSW Phone Number: 05/31/2019, 4:49 PM  Clinical Narrative:  CSW following patient for support and discharge needs. Patient came from [facility] short term rehab. CSW spoke with admission coordinator Kirstin and she would have to reach out to her higher up to get approval before she can take patient back. CSW will continue to follow patient for safe discharge plan      Expected Discharge Plan: [place] Barriers to Discharge: Continued Medical Work up  Expected Discharge Plan and Services Expected Discharge Plan: [place]   Discharge Planning Services: CM Consult Post Acute Care Choice: [place]                                         Social Determinants of Health (SDOH) Interventions    Readmission Risk Interventions No flowsheet data found.

## 2019-05-31 NOTE — Progress Notes (Signed)
[place]:  [employer]  Referral received from [place] at [facility].  TC to speak with the son Ludwig Clarks. Discussed hospice services at the SNF. SHe come from [facility] but they are hoping to find a pending medicaid bed for her to be able to transfer to. He is unsure if she has any more Medicare A days to use.  Explained to him that hospice services would provide comfort care and that the weekly blood draws at the oncologist office would stop as well as the blood transfusions. He is unsure if the family is ready to stop these and feels that maybe palliative care at the facility would be a better option because she would be able to continue the blood transfusions. He would prefer our hospice but depending on bed availability and which SNF they are able to go to will determine if we can serve the family (we must have a contract with chosen facility). Son was explained this as well. WE will continue to follow and support the pt and family until the family chooses the best options for them and can proceed if goals are in line and family comfortable with hospice care. Webb Silversmith RN [phone]

## 2019-06-01 ENCOUNTER — Inpatient Hospital Stay (HOSPITAL_COMMUNITY): Payer: Medicare Other

## 2019-06-01 ENCOUNTER — Encounter (HOSPITAL_COMMUNITY): Payer: Medicare Other

## 2019-06-01 ENCOUNTER — Encounter (HOSPITAL_COMMUNITY): Payer: Self-pay | Admitting: Interventional Radiology

## 2019-06-01 HISTORY — PX: IR NEPHROSTOGRAM LEFT THRU EXISTING ACCESS: IMG6061

## 2019-06-01 HISTORY — PX: IR NEPHROSTOGRAM RIGHT THRU EXISTING ACCESS: IMG6062

## 2019-06-01 LAB — BASIC METABOLIC PANEL
Anion gap: 8 (ref 5–15)
BUN: 15 mg/dL (ref 8–23)
CO2: 22 mmol/L (ref 22–32)
Calcium: 8.4 mg/dL — ABNORMAL LOW (ref 8.9–10.3)
Chloride: 117 mmol/L — ABNORMAL HIGH (ref 98–111)
Creatinine, Ser: 0.63 mg/dL (ref 0.44–1.00)
GFR calc Af Amer: >60 mL/min (ref >60)
GFR calc non Af Amer: >60 mL/min (ref >60)
Glucose, Bld: 85 mg/dL (ref 70–99)
Potassium: 3.5 mmol/L (ref 3.5–5.1)
Sodium: 147 mmol/L — ABNORMAL HIGH (ref 135–145)

## 2019-06-01 LAB — CBC WITH DIFFERENTIAL/PLATELET
Abs Immature Granulocytes: 0.75 10*3/uL — ABNORMAL HIGH (ref 0.00–0.07)
Basophils Absolute: 1.4 10*3/uL — ABNORMAL HIGH (ref 0.0–0.1)
Basophils Relative: 2 %
Eosinophils Absolute: 0.4 10*3/uL (ref 0.0–0.5)
Eosinophils Relative: 1 %
HCT: 34.2 % — ABNORMAL LOW (ref 36.0–46.0)
Hemoglobin: 10.9 g/dL — ABNORMAL LOW (ref 12.0–15.0)
Immature Granulocytes: 1 %
Lymphocytes Relative: 86 %
Lymphs Abs: 63.7 10*3/uL — ABNORMAL HIGH (ref 0.7–4.0)
MCH: 30.7 pg (ref 26.0–34.0)
MCHC: 31.9 g/dL (ref 30.0–36.0)
MCV: 96.3 fL (ref 80.0–100.0)
Monocytes Absolute: 6.4 10*3/uL — ABNORMAL HIGH (ref 0.1–1.0)
Monocytes Relative: 9 %
Neutro Abs: 0.6 10*3/uL — ABNORMAL LOW (ref 1.7–7.7)
Neutrophils Relative %: 1 %
Platelets: 42 10*3/uL — ABNORMAL LOW (ref 150–400)
RBC: 3.55 MIL/uL — ABNORMAL LOW (ref 3.87–5.11)
RDW: 19.4 % — ABNORMAL HIGH (ref 11.5–15.5)
WBC Morphology: ABNORMAL
WBC: 73.3 10*3/uL (ref 4.0–10.5)
nRBC: 0.2 % (ref 0.0–0.2)

## 2019-06-01 LAB — PREPARE PLATELET PHERESIS
Unit division: 0
Unit division: 0
Unit division: 0

## 2019-06-01 LAB — CULTURE, BLOOD (ROUTINE X 2)
Culture: NO GROWTH
Culture: NO GROWTH
Special Requests: ADEQUATE
Special Requests: ADEQUATE

## 2019-06-01 LAB — BLOOD GAS, ARTERIAL
Acid-Base Excess: 0.6 mmol/L (ref 0.0–2.0)
Bicarbonate: 23.4 mmol/L (ref 20.0–28.0)
O2 Content: 3 L/min
O2 Saturation: 91.8 %
Patient temperature: 98.6
pCO2 arterial: 32.6 mmHg (ref 32.0–48.0)
pH, Arterial: 7.469 — ABNORMAL HIGH (ref 7.350–7.450)
pO2, Arterial: 61.3 mmHg — ABNORMAL LOW (ref 83.0–108.0)

## 2019-06-01 LAB — BPAM PLATELET PHERESIS
Blood Product Expiration Date: 202011222359
Blood Product Expiration Date: 202011232359
Blood Product Expiration Date: 202011232359
ISSUE DATE / TIME: 202011220854
ISSUE DATE / TIME: 202011221303
ISSUE DATE / TIME: 202011230856
Unit Type and Rh: 6200
Unit Type and Rh: 6200
Unit Type and Rh: 6200

## 2019-06-01 LAB — GLUCOSE, CAPILLARY: Glucose-Capillary: 73 mg/dL (ref 70–99)

## 2019-06-01 MED ORDER — IOHEXOL 300 MG/ML  SOLN
100.0000 mL | Freq: Once | INTRAMUSCULAR | Status: AC | PRN
  Administered 2019-06-01: 13:00:00 20 mL

## 2019-06-01 NOTE — Progress Notes (Signed)
SLP Cancellation Note  Patient Details Name: Shelbylyn Shimmin MRN: [id number] DOB: 21-Nov-1931   Cancelled treatment:       Reason Eval/Treat Not Completed: Other (comment)(pt npo for nephrostomy placement, will continue efforts)   Macario Golds 06/01/2019, 10:12 AM Luanna Salk, MS [facility] SLP Acute Rehab Services Pager [phone] Office [phone]

## 2019-06-01 NOTE — Progress Notes (Addendum)
Patient noted to have increased heart rate on monitor. Upon entering patient room, patient skin warm and diaphoretic. Vitals obtained as well as CBG of 73. BP 133/73, RR 28, temp 99.3, and HR 101. Patient now a red MEWS. Patient with no PO intake today and very lethargic in comparison with prior 2 days. MD Adhikari made aware and new orders received. Will continue to monitor patient.

## 2019-06-01 NOTE — Care Management Important Message (Signed)
Important Message  Patient Details IM Letter given to Marney Doctor RN to present to the Patient Name: Samantha Mata MRN: [id number] Date of Birth: 1932-05-10   Medicare Important Message Given:  Yes     Kerin Salen 06/01/2019, 10:11 AM

## 2019-06-01 NOTE — Progress Notes (Signed)
Physical Therapy Treatment Patient Details Name: Samantha Mata MRN: [id number] DOB: 08/17/1931 Today's Date: 06/01/2019    History of Present Illness 85 y.o. female with history of small B-cell lymphoma, hyperlipidemia, depression, dementia, was brought in hospital for evaluation of generalized weakness, fever, hypotension. Dx of possible sepsis and concern for dislodgment of the right nephrostomy tube and IR plans for exchange of catheters 11/23    PT Comments    Pt would not open eyes however communicated verbally mostly with grunting.  Pt not following commands or assisting with UE and LE movement.  Pt +2 total assist for mobility previous session and with nursing.  Pt not actively attempting to participate so session limited.  Uncertain of future ability to participate/rehab potential.  Will likely check back on pt later on in the week as schedule permits.    Follow Up Recommendations  SNF;Supervision/Assistance - 24 hour     Equipment Recommendations  None recommended by PT    Recommendations for Other Services       Precautions / Restrictions Precautions Precautions: Fall Precaution Comments: colostomy, B nephrostomy drains    Mobility  Bed Mobility               General bed mobility comments: has been resisting attempts with nursing so did not perform today; also to go to IR today  Transfers                    Ambulation/Gait                 Stairs             Wheelchair Mobility    Modified Rankin (Stroke Patients Only)       Balance                                            Cognition Arousal/Alertness: Lethargic   Overall Cognitive Status: No family/caregiver present to determine baseline cognitive functioning                                 General Comments: Pt with h/o dementia.  No family present to provide info re: pt's baseline cognition.  Pt currently is unable to follow commands.       Exercises General Exercises - Upper Extremity Shoulder Flexion: PROM;Both;5 reps Elbow Flexion: PROM;Both;5 reps General Exercises - Lower Extremity Ankle Circles/Pumps: PROM;10 reps;Both Heel Slides: PROM;10 reps;Both Hip ABduction/ADduction: PROM;10 reps;Both    General Comments        Pertinent Vitals/Pain Pain Assessment: Faces Faces Pain Scale: No hurt    Home Living                      Prior Function            PT Goals (current goals can now be found in the care plan section) Progress towards PT goals: Not progressing toward goals - comment(pt not attempting to assist today)    Frequency    Min 1X/week      PT Plan Current plan remains appropriate;Frequency needs to be updated    Co-evaluation              AM-PAC PT "6 Clicks" Mobility   Outcome Measure  Help needed turning from your back to your  side while in a flat bed without using bedrails?: Total Help needed moving from lying on your back to sitting on the side of a flat bed without using bedrails?: Total Help needed moving to and from a bed to a chair (including a wheelchair)?: Total Help needed standing up from a chair using your arms (e.g., wheelchair or bedside chair)?: Total Help needed to walk in hospital room?: Total Help needed climbing 3-5 steps with a railing? : Total 6 Click Score: 6    End of Session   Activity Tolerance: Patient limited by lethargy Patient left: in bed;with call bell/phone within reach;with bed alarm set Nurse Communication: Mobility status PT Visit Diagnosis: Muscle weakness (generalized) (M62.81);Difficulty in walking, not elsewhere classified (R26.2);Adult, failure to thrive (R62.7)     Time: 1049-1100 PT Time Calculation (min) (ACUTE ONLY): 11 min  Charges:  $Therapeutic Exercise: 8-22 mins                    Carmelia Bake, PT, DPT Acute Rehabilitation Services Office: [phone] Pager: [phone]  Trena Platt 06/01/2019,  12:31 PM

## 2019-06-01 NOTE — Progress Notes (Addendum)
    Palliative care consult note  Reason for consult: Goals of care in light of CLL/B-cell lymphoma resistant to systemic therapy  I saw and examined Samantha Mata this afternoon.  She was sleeping and did not arouse on exam.  Discussed with bedside care team as well as Dr. Tawanna Solo and [facility] team.  I called and was able to reach her son, Samantha Mata.  We reviewed her clinical course over the past 24 hours.  Discussed again that she would be eligible for hospice services, including likely residential hospice which may be a good fit for her, but she would need to give up further transfusions in order to qualify.  At this point, and he reports that this would not be in line with family's goals but appreciates knowing that this would be an option at some point in the future.  We talked about the things that are most important to her and and he reports that she had been stating that she is "tired."  He reports he would be at peace if he felt that she is were at a point where she wanted to forego further transfusions, however, at this point in time he states that he is not sure if this would be what her decision is if she understood the situation.  I recommended family continue to discuss things that are most important to her and how we focus on these goals moving forward.  -DNR/DNI -Recommend palliative care to follow as an outpatient on discharge.  Please include this on the discharge summary if you agree is appropriate.  If family reaches a point where they are willing to forego further transfusions, she would certainly be eligible for hospice (including residential hospice)  Questions and concerns addressed. PMT will continue to support holistically.  Total time: 30 minutes  Greater than 50% of this time was spent counseling and coordinating care related to the above assessment and plan.  Micheline Rough, MD [place] Team [phone]

## 2019-06-01 NOTE — Progress Notes (Signed)
During assessment, patient noted to have significant swelling in R arm. MD Adhikari made aware and new orders placed. Will continue to monitor patient.

## 2019-06-01 NOTE — Progress Notes (Signed)
PROGRESS NOTE    Samantha Mata  [id number] DOB: 30-Sep-1931 DOA: 05/27/2019 PCP: Patient, No Pcp Per   Brief Narrative: Patient is 85 year old female with history of small B-cell lymphoma, hyperlipidemia, depression, dementia who was brought to the emergency department for the evaluation of generalized weakness.  She is from a skilled nursing facility.  Patient was unable to contribute anything to the history.  As per the report, she is currently getting treatment for her CLL at [facility].  Patient has a colostomy and bilateral nephrostomy tubes .  She has history of pelvic mass.  When she presented she was found to be hypotensive, febrile, tachycardic.  Sepsis was suspected.  UA was impressive for urinary tract infection.  Patient was started on broad-spectrum antibiotics. Palliative care also consulted for goals of care discussion and possible hospice consideration.  Recommended follow-up with hospice at discussion facility.  We are discussing with family for transitioning her care to residential hospice. There is concern for dislodgment of the right nephrostomy tube.  We have consulted IR.Plan for exchange of catheters today.  Assessment & Plan:   Principal Problem:   Febrile illness, acute Active Problems:   Chronic lymphocytic leukemia (CLL), B-cell (HCC)   Colostomy status (HCC)   Hydronephrosis with ureteropelvic junction (UPJ) obstruction   Acute lower UTI   Sepsis (HCC)   Protein-calorie malnutrition, severe   Pressure injury of skin  Suspected sepsis present on admission: Afebrile today.  Hemodynamically stable source of sepsis is unclear but could be urine. Lactic acid normal.  Covid test 19 test negative.  Chest x-ray showed  pleural effusion bilaterally but no pneumonia. Urine culture showed pansensitive ACINETOBACTER LWOFFII  And  KOCURIA SPECIES.  Continue cefepime. Blood cultures have not shown any growth. There is concern for turbid urine output from  the right nephrostomy tube.  Will send for culture.  Continue cefepime for now  CLL/leukocytosis: Severe leukocytosis.  Follows with oncology at [facility] with Dr Humphrey Rolls.  Chemotherapy was stopped because she was found to be resistant.  She has history of colon cancer dx in 1992 and underwent colostomy for bowel obstruction in 1997.  Severe thrombocytopenia: Due to CLL.S/p transfusion with  2 units  of platelets  Hydronephrosis with ureteropelvic junction obstruction: Has history of pelvic mass.  She is status post percutaneous nephrostomy.  Right-sided nephrostomy tube is almost dislodged and there is less urine output. We have discussed with radiology who will help to reposition the nephrostomy tube.Plan for nephrostomy tube exchange. Patient is also status post colostomy. CT abdomen/pelvis done on October 2020 showed Large tumor mass within pelvis 11.6 x 11.7 x 12.3 cm in size , bilateral distal ureteral obstruction with hydroureteronephrosis.  Findings were consistent with leukemia or lymphoma.  Renal insufficiency: Currently kidney function at baseline.  BUN of 40 on presentation.Improved with IV fluids.  We will continue to monitor  Altered mental status/ dementia: Patient is currently confused.   Current confusion could be due to acute metabolic encephalopathy from sepsis.  As per the son, she does not have a clear diagnosis of dementia but she is increasingly confused since last few weeks.  Macrocytic anemia: Normal iron, vitamin [id number], folic acid.  Hb dropped to the range of 6 and she was transfused with 2 units of PRBC.    Debility/deconditioning/multiple comorbidities/advanced age: Perative care following.  Patient is DNR.  She is from assisted living facility.  Palliative care recommended  palliative care follow-up at SNF.  Ongoing discussion with  family about transitioning her care to residential hospice because she is very debilitated, has malignancy, advanced age, transfusion  dependent.   Nutrition Problem: Severe Malnutrition Etiology: chronic illness(small B-cell lymphoma, dementia)      DVT prophylaxis: SCD Code Status: DNR Family Communication: Called son for update on 05/31/19 Disposition Plan: Residential hospice vs  skilled nursing facility with palliative care follow-up after her nephrostomy tube is repositioned.   Consultants: Palliative care  Procedures: None  Antimicrobials:  Anti-infectives (From admission, onward)   Start     Dose/Rate Route Frequency Ordered Stop   05/30/19 2200  ceFEPIme (MAXIPIME) 2 g in sodium chloride 0.9 % 100 mL IVPB     2 g 200 mL/hr over 30 Minutes Intravenous Every 12 hours 05/30/19 1312     05/29/19 0300  vancomycin (VANCOCIN) 500 mg in sodium chloride 0.9 % 100 mL IVPB  Status:  Discontinued     500 mg 100 mL/hr over 60 Minutes Intravenous Every 36 hours 05/27/19 2053 05/30/19 0911   05/28/19 1200  ceFEPIme (MAXIPIME) 2 g in sodium chloride 0.9 % 100 mL IVPB  Status:  Discontinued     2 g 200 mL/hr over 30 Minutes Intravenous Every 24 hours 05/27/19 2058 05/30/19 1312   05/28/19 1000  valACYclovir (VALTREX) tablet 500 mg     500 mg Oral Daily 05/27/19 1955     05/27/19 1215  vancomycin (VANCOCIN) IVPB 1000 mg/200 mL premix     1,000 mg 200 mL/hr over 60 Minutes Intravenous STAT 05/27/19 1210 05/27/19 1614   05/27/19 1215  ceFEPIme (MAXIPIME) 2 g in sodium chloride 0.9 % 100 mL IVPB     2 g 200 mL/hr over 30 Minutes Intravenous STAT 05/27/19 1210 05/27/19 1350   05/27/19 1215  metroNIDAZOLE (FLAGYL) IVPB 500 mg     500 mg 100 mL/hr over 60 Minutes Intravenous STAT 05/27/19 1210 05/27/19 1515      Subjective:  Patient seen and examined the bedside this morning.  Hemodynamically stable.  Looks more lethargic today.  She is undergoing nephrostomy tube exchange by IR today.  Did not Participate with any communication today.   Objective: Vitals:   05/31/19 2043 05/31/19 2129 06/01/19 0441 06/01/19  0753  BP:  115/60 124/69   Pulse:  86 96   Resp:  20 18   Temp:  97.9 F (36.6 C) 99.6 F (37.6 C)   TempSrc:  Oral Oral   SpO2: 95% 93% 91% 95%  Weight:      Height:        Intake/Output Summary (Last 24 hours) at 06/01/2019 1052 Last data filed at 06/01/2019 0853 Gross per 24 hour  Intake 1582.5 ml  Output 400 ml  Net 1182.5 ml   Filed Weights   05/28/19 0430 05/30/19 0519 05/31/19 0500  Weight: 40.8 kg 53.9 kg 54 kg    Examination:  General exam: Extremely debilitated, deconditioned, chronically ill looking, generalized weakness  HEENT:PERRL, Ear/Nose normal on gross exam Respiratory system: Bilateral decreased air entry on the bases Cardiovascular system: S1 & S2 heard, RRR. No JVD, murmurs, rubs, gallops or clicks. No pedal edema. Gastrointestinal system: Abdomen is nondistended, soft and nontender. Colostomy with dark stool, bilateral nephrostomy tubes.  Right tube almost dislodged with scant urine in the bag.  Left-sided tube draining clear urine.   Central nervous system: confused, eyes closed Extremities: No edema, no clubbing ,no cyanosis Skin: No rashes, lesions or ulcers,no icterus ,no pallor    Data Reviewed: I have personally  reviewed following labs and imaging studies  CBC: Recent Labs  Lab 05/27/19 1109 05/28/19 0530 05/29/19 0539 05/30/19 0528 05/31/19 0521 06/01/19 0713  WBC 184.2* 126.4* 109.8* 90.2* 89.6* 73.3*  NEUTROABS 2.5  --  2.1 2.2 2.1 0.6*  HGB 9.0* 7.6* 7.1* 6.8* 10.7* 10.9*  HCT 31.5* 27.0* 25.8* 23.8* 34.4* 34.2*  MCV 107.5* 108.9* 110.3* 108.2* 99.7 96.3  PLT 26* 21* 20* 18* 15* 42*   Basic Metabolic Panel: Recent Labs  Lab 05/28/19 0530 05/29/19 0539 05/30/19 0528 05/31/19 0521 06/01/19 0513  NA 146* 150* 147* 145 147*  K 4.1 3.9 3.6 4.0 3.5  CL 113* 119* 118* 115* 117*  CO2 22 22 22 22 22   GLUCOSE 109* 111* 108* 104* 85  BUN 34* 27* 20 17 15   CREATININE 0.91 0.90 0.77 0.71 0.63  CALCIUM 8.5* 8.6* 8.6* 8.6* 8.4*    GFR: Estimated Creatinine Clearance: 43.8 mL/min (by C-G formula based on SCr of 0.63 mg/dL). Liver Function Tests: Recent Labs  Lab 05/27/19 1109 05/28/19 0530  AST 46* 36  ALT 23 19  ALKPHOS 84 64  BILITOT 0.6 0.8  PROT 6.4* 5.4*  ALBUMIN 3.2* 2.7*   No results for input(s): LIPASE, AMYLASE in the last 168 hours. No results for input(s): AMMONIA in the last 168 hours. Coagulation Profile: No results for input(s): INR, PROTIME in the last 168 hours. Cardiac Enzymes: No results for input(s): CKTOTAL, CKMB, CKMBINDEX, TROPONINI in the last 168 hours. BNP (last 3 results) No results for input(s): PROBNP in the last 8760 hours. HbA1C: No results for input(s): HGBA1C in the last 72 hours. CBG: No results for input(s): GLUCAP in the last 168 hours. Lipid Profile: No results for input(s): CHOL, HDL, LDLCALC, TRIG, CHOLHDL, LDLDIRECT in the last 72 hours. Thyroid Function Tests: No results for input(s): TSH, T4TOTAL, FREET4, T3FREE, THYROIDAB in the last 72 hours. Anemia Panel: No results for input(s): VITAMINB12, FOLATE, FERRITIN, TIBC, IRON, RETICCTPCT in the last 72 hours. Sepsis Labs: Recent Labs  Lab 05/27/19 1109  LATICACIDVEN 1.0    Recent Results (from the past 240 hour(s))  Urine culture     Status: Abnormal   Collection Time: 05/27/19 10:50 AM   Specimen: In/Out Cath Urine  Result Value Ref Range Status   Specimen Description   Final    IN/OUT CATH URINE LT SIDE Performed at [facility] [address] [place], [place] [address]    Special Requests   Final    NONE Performed at [facility], [place] [address] [place], [place] [address]    Culture (A)  Final    >=100,000 COLONIES/mL ACINETOBACTER LWOFFII >=100,000 COLONIES/mL KOCURIA SPECIES Standardized susceptibility testing for this organism is not available. Performed at [facility], [place] [address], [place], [place] [address]    Report Status 05/29/2019  FINAL  Final   Organism ID, Bacteria ACINETOBACTER LWOFFII (A)  Final      Susceptibility   Acinetobacter lwoffii - MIC*    CEFTAZIDIME 8 SENSITIVE Sensitive     CEFTRIAXONE 16 INTERMEDIATE Intermediate     CIPROFLOXACIN <=0.25 SENSITIVE Sensitive     GENTAMICIN <=1 SENSITIVE Sensitive     IMIPENEM <=0.25 SENSITIVE Sensitive     PIP/TAZO 16 SENSITIVE Sensitive     TRIMETH/SULFA <=20 SENSITIVE Sensitive     CEFEPIME 2 SENSITIVE Sensitive     AMPICILLIN/SULBACTAM <=2 SENSITIVE Sensitive     * >=100,000 COLONIES/mL ACINETOBACTER LWOFFII  SARS CORONAVIRUS 2 (TAT 6-24  HRS) Nasopharyngeal Nasopharyngeal Swab     Status: None   Collection Time: 05/27/19 10:53 AM   Specimen: Nasopharyngeal Swab  Result Value Ref Range Status   SARS Coronavirus 2 NEGATIVE NEGATIVE Final    Comment: (NOTE) SARS-CoV-2 target nucleic acids are NOT DETECTED. The SARS-CoV-2 RNA is generally detectable in upper and lower respiratory specimens during the acute phase of infection. Negative results do not preclude SARS-CoV-2 infection, do not rule out co-infections with other pathogens, and should not be used as the sole basis for treatment or other patient management decisions. Negative results must be combined with clinical observations, patient history, and epidemiological information. The expected result is Negative. Fact Sheet for Patients: SugarRoll.be Fact Sheet for Healthcare Providers: https://www.woods-mathews.com/ This test is not yet approved or cleared by the [place] FDA and  has been authorized for detection and/or diagnosis of SARS-CoV-2 by FDA under an Emergency Use Authorization (EUA). This EUA will remain  in effect (meaning this test can be used) for the duration of the COVID-19 declaration under Section 56 4(b)(1) of the Act, 21 U.S.C. section 360bbb-3(b)(1), unless the authorization is terminated or revoked sooner. Performed at [facility], [place] [address] [place], [place] [address]   Blood Culture (routine x 2)     Status: None   Collection Time: 05/27/19 10:55 AM   Specimen: BLOOD  Result Value Ref Range Status   Specimen Description   Final    BLOOD RIGHT ANTECUBITAL Performed at [facility] [address], [place], [place] [address]    Special Requests   Final    BOTTLES DRAWN AEROBIC AND ANAEROBIC Blood Culture adequate volume Performed at [facility] [address], [place], [place] [address]    Culture   Final    NO GROWTH 5 DAYS Performed at [facility], [place] [address] [place], [place] [address]    Report Status 06/01/2019 FINAL  Final  Blood Culture (routine x 2)     Status: None   Collection Time: 05/27/19 11:09 AM   Specimen: BLOOD  Result Value Ref Range Status   Specimen Description   Final    BLOOD LEFT ANTECUBITAL Performed at [facility] [address], [place], [place] [address]    Special Requests   Final    BOTTLES DRAWN AEROBIC AND ANAEROBIC Blood Culture adequate volume Performed at [facility] [address] [place], [place] [address]    Culture   Final    NO GROWTH 5 DAYS Performed at [facility], [place] [address], [place], [place] [address]    Report Status 06/01/2019 FINAL  Final  MRSA PCR Screening     Status: None   Collection Time: 05/28/19  5:00 AM   Specimen: Nasal Mucosa; Nasopharyngeal  Result Value Ref Range Status   MRSA by PCR NEGATIVE NEGATIVE Final    Comment:        The GeneXpert MRSA Assay (FDA approved for NASAL specimens only), is one component of a comprehensive MRSA colonization surveillance program. It is not intended to diagnose MRSA infection nor to guide or monitor treatment for MRSA infections. Performed at [facility], [place] [address] [place], [place] [address]          Radiology Studies: Dg Chest 1 View  Result Date:  05/31/2019 CLINICAL DATA:  Shortness of breath EXAM: CHEST  1 VIEW COMPARISON:  05/27/2019 FINDINGS: Left-sided chest port terminates within the  SVC. Stable cardiomediastinal contours. Persistent bilateral pleural effusions, right slightly greater than left with associated hazy bibasilar opacities. No pneumothorax. IMPRESSION: No significant change in bilateral pleural effusions, right greater than left with associated hazy bibasilar opacities. Electronically Signed   By: Davina Poke M.D.   On: 05/31/2019 13:30        Scheduled Meds:  sodium chloride   Intravenous Once   sodium chloride   Intravenous Once   budesonide (PULMICORT) nebulizer solution  0.25 mg Nebulization BID   cholecalciferol  2,000 Units Oral Daily   feeding supplement (ENSURE ENLIVE)  237 mL Oral BID BM   ferrous sulfate  325 mg Oral BID WC   magnesium oxide  400 mg Oral Daily   multivitamin with minerals  1 tablet Oral Daily   traZODone  50 mg Oral QHS   valACYclovir  500 mg Oral Daily   Continuous Infusions:  sodium chloride 75 mL/hr at 06/01/19 0959   ceFEPime (MAXIPIME) IV 2 g (06/01/19 0957)     LOS: 5 days    Time spent: 35 mins,More than 50% of that time was spent in counseling and/or coordination of care.      Shelly Coss, MD [facility] Pager [phone]  If 7PM-7AM, please contact night-coverage [email or web address] Password [email or web address] 06/01/2019, 10:52 AM

## 2019-06-02 ENCOUNTER — Inpatient Hospital Stay (HOSPITAL_COMMUNITY): Payer: Medicare Other

## 2019-06-02 DIAGNOSIS — Z515 Encounter for palliative care: Secondary | ICD-10-CM

## 2019-06-02 DIAGNOSIS — Z7189 Other specified counseling: Secondary | ICD-10-CM

## 2019-06-02 DIAGNOSIS — M7989 Other specified soft tissue disorders: Secondary | ICD-10-CM

## 2019-06-02 LAB — CBC WITH DIFFERENTIAL/PLATELET
Abs Immature Granulocytes: 0.72 10*3/uL — ABNORMAL HIGH (ref 0.00–0.07)
Basophils Absolute: 1.5 10*3/uL — ABNORMAL HIGH (ref 0.0–0.1)
Basophils Relative: 2 %
Eosinophils Absolute: 0.2 10*3/uL (ref 0.0–0.5)
Eosinophils Relative: 0 %
HCT: 34.1 % — ABNORMAL LOW (ref 36.0–46.0)
Hemoglobin: 10.7 g/dL — ABNORMAL LOW (ref 12.0–15.0)
Immature Granulocytes: 1 %
Lymphocytes Relative: 86 %
Lymphs Abs: 62 10*3/uL — ABNORMAL HIGH (ref 0.7–4.0)
MCH: 31.2 pg (ref 26.0–34.0)
MCHC: 31.4 g/dL (ref 30.0–36.0)
MCV: 99.4 fL (ref 80.0–100.0)
Monocytes Absolute: 7.3 10*3/uL — ABNORMAL HIGH (ref 0.1–1.0)
Monocytes Relative: 10 %
Neutro Abs: 0.8 10*3/uL — ABNORMAL LOW (ref 1.7–7.7)
Neutrophils Relative %: 1 %
Platelets: 31 10*3/uL — ABNORMAL LOW (ref 150–400)
RBC: 3.43 MIL/uL — ABNORMAL LOW (ref 3.87–5.11)
RDW: 19.6 % — ABNORMAL HIGH (ref 11.5–15.5)
WBC Morphology: ABNORMAL
WBC: 72.5 10*3/uL (ref 4.0–10.5)
nRBC: 0.2 % (ref 0.0–0.2)

## 2019-06-02 LAB — GLUCOSE, CAPILLARY
Glucose-Capillary: 76 mg/dL (ref 70–99)
Glucose-Capillary: 73 mg/dL (ref 70–99)

## 2019-06-02 LAB — BASIC METABOLIC PANEL
Anion gap: 7 (ref 5–15)
BUN: 18 mg/dL (ref 8–23)
CO2: 22 mmol/L (ref 22–32)
Calcium: 8.5 mg/dL — ABNORMAL LOW (ref 8.9–10.3)
Chloride: 118 mmol/L — ABNORMAL HIGH (ref 98–111)
Creatinine, Ser: 0.66 mg/dL (ref 0.44–1.00)
GFR calc Af Amer: >60 mL/min (ref >60)
GFR calc non Af Amer: >60 mL/min (ref >60)
Glucose, Bld: 87 mg/dL (ref 70–99)
Potassium: 4 mmol/L (ref 3.5–5.1)
Sodium: 147 mmol/L — ABNORMAL HIGH (ref 135–145)

## 2019-06-02 MED ORDER — LIDOCAINE HCL 1 % IJ SOLN
INTRAMUSCULAR | Status: AC
  Filled 2019-06-02: qty 20

## 2019-06-02 NOTE — Progress Notes (Signed)
Daily Progress Note   Patient Name: Samantha Mata       Date: 06/02/2019 DOB: [birth year]  Age: 85 y.o. MRN#: [id number] Attending Physician: Shelly Coss, MD Primary Care Physician: Patient, No Pcp Per Admit Date: 05/27/2019  Reason for Consultation/Follow-up: Establishing goals of care  Subjective:  patient is resting in bed, she appears chronically ill, does not arouse, does not respond to questions.   Length of Stay: 6  Current Medications: Scheduled Meds:  . sodium chloride   Intravenous Once  . sodium chloride   Intravenous Once  . budesonide (PULMICORT) nebulizer solution  0.25 mg Nebulization BID  . cholecalciferol  2,000 Units Oral Daily  . feeding supplement (ENSURE ENLIVE)  237 mL Oral BID BM  . ferrous sulfate  325 mg Oral BID WC  . magnesium oxide  400 mg Oral Daily  . multivitamin with minerals  1 tablet Oral Daily  . traZODone  50 mg Oral QHS  . valACYclovir  500 mg Oral Daily    Continuous Infusions: . ceFEPime (MAXIPIME) IV 2 g (06/02/19 1036)    PRN Meds: acetaminophen (TYLENOL) oral liquid 160 mg/5 mL, acetaminophen **OR** acetaminophen, ondansetron **OR** ondansetron (ZOFRAN) IV, polyethylene glycol  Physical Exam         Appears weak and chronically ill Decreased breath sounds Thin with muscle wasting evident Has colostomy No edema Not awake, not alert  Vital Signs: BP 128/68 (BP Location: Right Arm)   Pulse (!) 106   Temp 99.5 F (37.5 C) (Oral)   Resp (!) 30   Ht 5\' 6"  (1.676 m)   Wt 54 kg   SpO2 91%   BMI 19.21 kg/m  SpO2: SpO2: 91 % O2 Device: O2 Device: Nasal Cannula O2 Flow Rate: O2 Flow Rate (L/min): 3 L/min  Intake/output summary:   Intake/Output Summary (Last 24 hours) at 06/02/2019 1415 Last data filed at 06/02/2019 [id number]  Gross per 24 hour  Intake 3800 ml  Output 900 ml  Net 2900 ml   LBM: Last BM Date: 06/01/19 Baseline Weight: Weight: 40.8 kg Most recent weight: Weight: 54 kg       Palliative Assessment/Data:    Flowsheet Rows     Most Recent Value  Intake Tab  Referral Department  Hospitalist  Unit at Time of Referral  Med/Surg Unit  Palliative  Care Primary Diagnosis  Cancer  Date Notified  05/28/19  Clinical Assessment  Psychosocial & Spiritual Assessment  Palliative Care Outcomes      Patient Active Problem List   Diagnosis Date Noted  . Protein-calorie malnutrition, severe 05/28/2019  . Pressure injury of skin 05/28/2019  . Acute lower UTI 05/27/2019  . Sepsis ([place]) 05/27/2019  . Hydronephrosis with ureteropelvic junction (UPJ) obstruction 04/20/2019  . Febrile illness, acute 04/02/2019  . Chronic lymphocytic leukemia (CLL), B-cell ([place]) 12/20/2014  . Colostomy status ([place]) 12/20/2014    Palliative Care Assessment & Plan   Patient Profile:    Assessment:  85 yo lady with suspected sepsis< CLL, hydronephrosis, dementia, pleural effusion. Ongoing decline PMT following.   Recommendations/Plan:  Call placed and discussed with son Ludwig Clarks. He has been corresponding with my colleague Dr Domingo Cocking for the past few days. He has also reviewed the booklet "hard choices" Today, in a phone call, Ludwig Clarks had questions about the type of care that is provided in a residential hospice, he specifically had questions from the book about artificial nutrition and hydration at end of life.  We spent time on the phone reviewing Ms Aboud's life. 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  Son Ludwig Clarks is discussing with other family members about final disposition. I have tried to answer his questions about hospice care, to the best of my ability. Eddie requests that patient not be d/c until tomorrow so that he may discuss further with other relatives regarding transfer of the patient to  residential hospice, preferably in [place], [place].    Goals of Care and Additional Recommendations:  Limitations on Scope of Treatment:   Code Status:    Code Status Orders  (From admission, onward)         Start     Ordered   05/27/19 1955  Do not attempt resuscitation (DNR)  Continuous    Question Answer Comment  In the event of cardiac or respiratory ARREST Do not call a "code blue"   In the event of cardiac or respiratory ARREST Do not perform Intubation, CPR, defibrillation or ACLS   In the event of cardiac or respiratory ARREST Use medication by any route, position, wound care, and other measures to relive pain and suffering. May use oxygen, suction and manual treatment of airway obstruction as needed for comfort.      05/27/19 1955        Code Status History    This patient has a current code status but no historical code status.   Advance Care Planning Activity       Prognosis:   < 2 weeks  Discharge Planning:  To Be Determined  Care plan was discussed with  Patient's son Ludwig Clarks on the phone.   Thank you for allowing the Palliative Medicine Team to assist in the care of this patient.   Time In: 1300 Time Out: 1335 Total Time 35 Prolonged Time Billed  no       Greater than 50%  of this time was spent counseling and coordinating care related to the above assessment and plan.  Loistine Chance, MD  Please contact Palliative Medicine Team phone at [phone] for questions and concerns.

## 2019-06-02 NOTE — Progress Notes (Signed)
Left upper extremity venous duplex has been completed. Preliminary results can be found in CV Proc through chart review.  Results were given to the patient's nurse, Pam.  06/02/19 9:26 AM Samantha Mata RVT

## 2019-06-02 NOTE — Procedures (Addendum)
Ultrasound-guided  therapeutic right thoracentesis performed yielding 500 cc of bloody fluid. No immediate complications. Follow-up chest x-ray pending.EBL none.Primary MD made aware of above.

## 2019-06-02 NOTE — Progress Notes (Signed)
PROGRESS NOTE    Samantha Mata  [id number] DOB: 1932-04-24 DOA: 05/27/2019 PCP: Patient, No Pcp Per   Brief Narrative: Patient is 85 year old female with history of small B-cell lymphoma, hyperlipidemia, depression, dementia who was brought to the emergency department for the evaluation of generalized weakness.  She is from a skilled nursing facility.  Patient was unable to contribute anything to the history.  As per the report, she is currently getting treatment for her CLL at [facility].  Patient has a colostomy and bilateral nephrostomy tubes .  She has history of pelvic mass.  When she presented she was found to be hypotensive, febrile, tachycardic.  Sepsis was suspected.  UA was impressive for urinary tract infection.  Patient was started on broad-spectrum antibiotics. Palliative care also consulted for goals of care discussion and possible hospice consideration.   We are discussing with family for transitioning her care to residential hospice.She is not stable for discharge to skilled nursing facility. IR  Exchanged her bilateral nephrostomy tube because they were almost dislodged.  Chest x-ray showed bilateral moderate to large pleural effusion.  I have requested for thoracentesis. Waiting for family's final discussion about residential hospice.  Assessment & Plan:   Principal Problem:   Febrile illness, acute Active Problems:   Chronic lymphocytic leukemia (CLL), B-cell (HCC)   Colostomy status (HCC)   Hydronephrosis with ureteropelvic junction (UPJ) obstruction   Acute lower UTI   Sepsis (HCC)   Protein-calorie malnutrition, severe   Pressure injury of skin  Suspected sepsis present on admission: Afebrile today.  Hemodynamically stable source of sepsis is unclear but could be urine. Lactic acid normal.  Covid test 19 test negative.  Chest x-ray showed  pleural effusion bilaterally but no pneumonia. Urine culture showed pansensitive ACINETOBACTER LWOFFII  And   KOCURIA SPECIES.Now last urine cultures showing some Enterobacter faecalis( susceptibility pending). On  cefepime. Blood cultures have not shown any growth.  CLL/leukocytosis: Severe leukocytosis.  Follows with oncology at [facility] with Dr Humphrey Rolls.  Chemotherapy was stopped because she was found to be resistant.  She has history of colon cancer dx in 1992 and underwent colostomy for bowel obstruction in 1997.  Severe thrombocytopenia: Due to CLL.S/p transfusion with  2 units  of platelets  Hydronephrosis with ureteropelvic junction obstruction: Has history of pelvic mass.  She is status post percutaneous nephrostomy.  Right-sided nephrostomy tube is almost dislodged and there is less urine output.Underwent  nephrostomy tube exchange. Patient is also status post colostomy. CT abdomen/pelvis done on October 2020 showed Large tumor mass within pelvis 11.6 x 11.7 x 12.3 cm in size , bilateral distal ureteral obstruction with hydroureteronephrosis.  Findings were consistent with leukemia or lymphoma.  Renal insufficiency: Currently kidney function at baseline.  BUN of 40 on presentation.Improved with IV fluids.  We will continue to monitor  Altered mental status/ dementia: Patient is currently confused.   Current confusion could be due to acute metabolic encephalopathy from sepsis and other comorbidities.  As per the son, she does not have a clear diagnosis of dementia but she is increasingly confused since last few weeks.CT head did not show any acute intracranial abnormalities.  Bilateral moderate to severe pleural effusion: Currently requiring 4 L of oxygen per minute.  Chest imaging did not show pneumonia.  We will l attempt for right-sided thoracentesis today  Macrocytic anemia: Normal iron, vitamin 123456, folic acid.  Hb dropped to the range of 6 and she was transfused with 2 units of PRBC.  Hb stable today  Debility/deconditioning/multiple comorbidities/advanced age: Palliative care following.   Patient is DNR.  She is from assisted living facility.  Palliative care recommended  palliative care follow-up at SNF.  Ongoing discussion with family about transitioning her care to residential hospice because she is very debilitated, has malignancy, advanced age, transfusion dependent.   Nutrition Problem: Severe Malnutrition Etiology: chronic illness(small B-cell lymphoma, dementia)      DVT prophylaxis: SCD Code Status: DNR Family Communication: Called son Ludwig Clarks for update on 06/02/19 Disposition Plan: Residential hospice vs  skilled nursing facility with palliative care follow-up .waiting for family's decision on hospice.  She is not stable for discharge to skilled nursing facility.  Anticipate hospital death if she does not go to hospice.   Consultants: Palliative care  Procedures: None  Antimicrobials:  Anti-infectives (From admission, onward)   Start     Dose/Rate Route Frequency Ordered Stop   05/30/19 2200  ceFEPIme (MAXIPIME) 2 g in sodium chloride 0.9 % 100 mL IVPB     2 g 200 mL/hr over 30 Minutes Intravenous Every 12 hours 05/30/19 1312     05/29/19 0300  vancomycin (VANCOCIN) 500 mg in sodium chloride 0.9 % 100 mL IVPB  Status:  Discontinued     500 mg 100 mL/hr over 60 Minutes Intravenous Every 36 hours 05/27/19 2053 05/30/19 0911   05/28/19 1200  ceFEPIme (MAXIPIME) 2 g in sodium chloride 0.9 % 100 mL IVPB  Status:  Discontinued     2 g 200 mL/hr over 30 Minutes Intravenous Every 24 hours 05/27/19 2058 05/30/19 1312   05/28/19 1000  valACYclovir (VALTREX) tablet 500 mg     500 mg Oral Daily 05/27/19 1955     05/27/19 1215  vancomycin (VANCOCIN) IVPB 1000 mg/200 mL premix     1,000 mg 200 mL/hr over 60 Minutes Intravenous STAT 05/27/19 1210 05/27/19 1614   05/27/19 1215  ceFEPIme (MAXIPIME) 2 g in sodium chloride 0.9 % 100 mL IVPB     2 g 200 mL/hr over 30 Minutes Intravenous STAT 05/27/19 1210 05/27/19 1350   05/27/19 1215  metroNIDAZOLE (FLAGYL) IVPB 500 mg       500 mg 100 mL/hr over 60 Minutes Intravenous STAT 05/27/19 1210 05/27/19 1515      Subjective:  Patient seen and examined at bedside this morning.  Hemodynamically stable.  Tachypneic.  Less responsive than she was few days ago.  Did not participate with any meaningful conversation.  Objective: Vitals:   06/02/19 0511 06/02/19 0616 06/02/19 0830 06/02/19 0832  BP: 128/67 132/69  128/70  Pulse: (!) 101 (!) 101  (!) 104  Resp: (!) 29   (!) 30  Temp: 97.8 F (36.6 C)   98.5 F (36.9 C)  TempSrc: Oral   Oral  SpO2: 94% 95% 94% 96%  Weight:      Height:        Intake/Output Summary (Last 24 hours) at 06/02/2019 1248 Last data filed at 06/02/2019 [id number] Gross per 24 hour  Intake 3800 ml  Output 900 ml  Net 2900 ml   Filed Weights   05/28/19 0430 05/30/19 0519 05/31/19 0500  Weight: 40.8 kg 53.9 kg 54 kg    Examination:  General exam: Extremely debilitated, deconditioned, chronically ill looking, generalized weakness ,lethargic  HEENT:PERRL, Ear/Nose normal on gross exam Respiratory system: Bilateral decreased air entry on the bases,tachypnea Cardiovascular system: S1 & S2 heard, RRR. No JVD, murmurs, rubs, gallops or clicks. No pedal edema. Gastrointestinal system: Abdomen is  nondistended, soft and nontender. Colostomy with dark stool, bilateral nephrostomy tubes.   draining clear urine.   Central nervous system: confused, eyes closed Extremities: No edema, no clubbing ,no cyanosis Skin: No rashes, lesions ,no icterus ,no pallor    Data Reviewed: I have personally reviewed following labs and imaging studies  CBC: Recent Labs  Lab 05/29/19 0539 05/30/19 0528 05/31/19 0521 06/01/19 0713 06/02/19 0551  WBC 109.8* 90.2* 89.6* 73.3* 72.5*  NEUTROABS 2.1 2.2 2.1 0.6* 0.8*  HGB 7.1* 6.8* 10.7* 10.9* 10.7*  HCT 25.8* 23.8* 34.4* 34.2* 34.1*  MCV 110.3* 108.2* 99.7 96.3 99.4  PLT 20* 18* 15* 42* 31*   Basic Metabolic Panel: Recent Labs  Lab 05/29/19 0539  05/30/19 0528 05/31/19 0521 06/01/19 0513 06/02/19 0551  NA 150* 147* 145 147* 147*  K 3.9 3.6 4.0 3.5 4.0  CL 119* 118* 115* 117* 118*  CO2 22 22 22 22 22   GLUCOSE 111* 108* 104* 85 87  BUN 27* 20 17 15 18   CREATININE 0.90 0.77 0.71 0.63 0.66  CALCIUM 8.6* 8.6* 8.6* 8.4* 8.5*   GFR: Estimated Creatinine Clearance: 43.8 mL/min (by C-G formula based on SCr of 0.66 mg/dL). Liver Function Tests: Recent Labs  Lab 05/27/19 1109 05/28/19 0530  AST 46* 36  ALT 23 19  ALKPHOS 84 64  BILITOT 0.6 0.8  PROT 6.4* 5.4*  ALBUMIN 3.2* 2.7*   No results for input(s): LIPASE, AMYLASE in the last 168 hours. No results for input(s): AMMONIA in the last 168 hours. Coagulation Profile: No results for input(s): INR, PROTIME in the last 168 hours. Cardiac Enzymes: No results for input(s): CKTOTAL, CKMB, CKMBINDEX, TROPONINI in the last 168 hours. BNP (last 3 results) No results for input(s): PROBNP in the last 8760 hours. HbA1C: No results for input(s): HGBA1C in the last 72 hours. CBG: Recent Labs  Lab 06/01/19 1735 06/02/19 0147 06/02/19 0831  GLUCAP 73 76 73   Lipid Profile: No results for input(s): CHOL, HDL, LDLCALC, TRIG, CHOLHDL, LDLDIRECT in the last 72 hours. Thyroid Function Tests: No results for input(s): TSH, T4TOTAL, FREET4, T3FREE, THYROIDAB in the last 72 hours. Anemia Panel: No results for input(s): VITAMINB12, FOLATE, FERRITIN, TIBC, IRON, RETICCTPCT in the last 72 hours. Sepsis Labs: Recent Labs  Lab 05/27/19 1109  LATICACIDVEN 1.0    Recent Results (from the past 240 hour(s))  Urine culture     Status: Abnormal   Collection Time: 05/27/19 10:50 AM   Specimen: In/Out Cath Urine  Result Value Ref Range Status   Specimen Description   Final    IN/OUT CATH URINE LT SIDE Performed at [facility] [address] [place], [place] [address]    Special Requests   Final    NONE Performed at [facility], [place] [address] [place], [place] [address]    Culture (A)  Final    >=100,000 COLONIES/mL ACINETOBACTER LWOFFII >=100,000 COLONIES/mL KOCURIA SPECIES Standardized susceptibility testing for this organism is not available. Performed at [facility], [place] [address], [place], [place] [address]    Report Status 05/29/2019 FINAL  Final   Organism ID, Bacteria ACINETOBACTER LWOFFII (A)  Final      Susceptibility   Acinetobacter lwoffii - MIC*    CEFTAZIDIME 8 SENSITIVE Sensitive     CEFTRIAXONE 16 INTERMEDIATE Intermediate     CIPROFLOXACIN <=0.25 SENSITIVE Sensitive     GENTAMICIN <=1 SENSITIVE Sensitive     IMIPENEM <=0.25 SENSITIVE Sensitive  PIP/TAZO 16 SENSITIVE Sensitive     TRIMETH/SULFA <=20 SENSITIVE Sensitive     CEFEPIME 2 SENSITIVE Sensitive     AMPICILLIN/SULBACTAM <=2 SENSITIVE Sensitive     * >=100,000 COLONIES/mL ACINETOBACTER LWOFFII  SARS CORONAVIRUS 2 (TAT 6-24 HRS) Nasopharyngeal Nasopharyngeal Swab     Status: None   Collection Time: 05/27/19 10:53 AM   Specimen: Nasopharyngeal Swab  Result Value Ref Range Status   SARS Coronavirus 2 NEGATIVE NEGATIVE Final    Comment: (NOTE) SARS-CoV-2 target nucleic acids are NOT DETECTED. The SARS-CoV-2 RNA is generally detectable in upper and lower respiratory specimens during the acute phase of infection. Negative results do not preclude SARS-CoV-2 infection, do not rule out co-infections with other pathogens, and should not be used as the sole basis for treatment or other patient management decisions. Negative results must be combined with clinical observations, patient history, and epidemiological information. The expected result is Negative. Fact Sheet for Patients: SugarRoll.be Fact Sheet for Healthcare Providers: https://www.woods-mathews.com/ This test is not yet approved or cleared by the [place] FDA and  has been authorized for detection and/or diagnosis of SARS-CoV-2 by FDA  under an Emergency Use Authorization (EUA). This EUA will remain  in effect (meaning this test can be used) for the duration of the COVID-19 declaration under Section 56 4(b)(1) of the Act, 21 U.S.C. section 360bbb-3(b)(1), unless the authorization is terminated or revoked sooner. Performed at [facility], [place] [address], [place], [place] [address]   Blood Culture (routine x 2)     Status: None   Collection Time: 05/27/19 10:55 AM   Specimen: BLOOD  Result Value Ref Range Status   Specimen Description   Final    BLOOD RIGHT ANTECUBITAL Performed at [facility] [address], [place], [place] [address]    Special Requests   Final    BOTTLES DRAWN AEROBIC AND ANAEROBIC Blood Culture adequate volume Performed at [facility] [address] [place], [place] [address]    Culture   Final    NO GROWTH 5 DAYS Performed at [facility], [place] [address] [place], [place] [address]    Report Status 06/01/2019 FINAL  Final  Blood Culture (routine x 2)     Status: None   Collection Time: 05/27/19 11:09 AM   Specimen: BLOOD  Result Value Ref Range Status   Specimen Description   Final    BLOOD LEFT ANTECUBITAL Performed at [facility] [address] [place] [address], [place], [place] [address]    Special Requests   Final    BOTTLES DRAWN AEROBIC AND ANAEROBIC Blood Culture adequate volume Performed at [facility] [address], [place], [place] [address]    Culture   Final    NO GROWTH 5 DAYS Performed at [facility], [place] [address], [place], [place] [address]    Report Status 06/01/2019 FINAL  Final  MRSA PCR Screening     Status: None   Collection Time: 05/28/19  5:00 AM   Specimen: Nasal Mucosa; Nasopharyngeal  Result Value Ref Range Status   MRSA by PCR NEGATIVE NEGATIVE Final    Comment:        The GeneXpert MRSA Assay (FDA approved for NASAL specimens only), is one component of  a comprehensive MRSA colonization surveillance program. It is not intended to diagnose MRSA infection nor to guide or monitor treatment for MRSA infections. Performed at [facility], [place] [address]  [place], [place] [address]   Culture, Urine     Status: Abnormal (Preliminary result)   Collection Time: 05/31/19 12:18 PM   Specimen: Urine, Random  Result Value Ref Range Status   Specimen Description   Final    URINE, RANDOM Performed at [facility] [address] [place], [place] [address]    Special Requests   Final    Immunocompromised Performed at [facility], [place] [address], [place], [place] [address]    Culture (A)  Final    40,000 COLONIES/mL ENTEROCOCCUS FAECALIS SUSCEPTIBILITIES TO FOLLOW Performed at [facility], [place] [address], [place], [place] [address]    Report Status PENDING  Incomplete         Radiology Studies: Dg Chest 1 View  Result Date: 06/01/2019 CLINICAL DATA:  Shortness of breath. EXAM: CHEST  1 VIEW COMPARISON:  May 31, 2019 FINDINGS: The heart size remains enlarged. The left-sided Port-A-Cath is unchanged in positioning. There are persistent moderate to large bilateral pleural effusions with adjacent airspace disease favored to represent atelectasis. There is no pneumothorax. No acute osseous abnormality. IMPRESSION: No significant interval change. Persistent bilateral pleural effusions with adjacent atelectasis. Electronically Signed   By: Constance Holster M.D.   On: 06/01/2019 18:27   Ct Head Wo Contrast  Result Date: 06/01/2019 CLINICAL DATA:  Encephalopathy EXAM: CT HEAD WITHOUT CONTRAST TECHNIQUE: Contiguous axial images were obtained from the base of the skull through the vertex without intravenous contrast. COMPARISON:  None. FINDINGS: Brain: There is no mass, hemorrhage or extra-axial collection. The size and configuration of the ventricles and extra-axial CSF spaces are normal.  There is hypoattenuation of the white matter, most commonly indicating chronic small vessel disease. Vascular: No abnormal hyperdensity of the major intracranial arteries or dural venous sinuses. No intracranial atherosclerosis. Skull: The visualized skull base, calvarium and extracranial soft tissues are normal. Sinuses/Orbits: Complete opacification of the mastoid air cells. There is diffuse mild mucosal thickening of the paranasal sinuses. The orbits are normal. IMPRESSION: 1. No acute intracranial abnormality. 2. Chronic small vessel disease. 3. Complete opacification of the mastoid air cells and mild mucosal thickening of the paranasal sinuses. Electronically Signed   By: Ulyses Jarred M.D.   On: 06/01/2019 22:32   Ir Nephrostogram Left Thru Existing Access  Result Date: 06/01/2019 INDICATION: 85 year old female with a history of pelvic mass causing bilateral ureteral obstruction. She had percutaneous nephrostomy tubes placed at an outside hospital on 04/11/2019. Recently, 1 of the tubes is concerned to have become partially displaced and output has decreased. She presents for nephrostomy tube injection and possible exchange if warranted. Patient is currently altered and not cooperative. EXAM: Bilateral nephrostomy tube injections COMPARISON:  Outside imaging dated 04/11/2019 MEDICATIONS: None. ANESTHESIA/SEDATION: None. CONTRAST:  20 mL Omnipaque 300-administered into the collecting system(s) FLUOROSCOPY TIME:  Fluoroscopy Time: 0 minutes 48 seconds (5 mGy). COMPLICATIONS: None immediate. PROCEDURE: Contrast injection was performed of the existing left percutaneous nephrostomy tube. The tube is well positioned in the renal pelvis. Of note, there is no hydronephrosis and the ureter drains into the bladder. Attention was next turned to the existing right percutaneous nephrostomy tube. Contrast injection was performed. The tube is slightly pulled back into the lower pole infundibulum but remains within the  renal collecting system. Again, the ureter appears patent at this time with contrast material draining into the bladder albeit slowly. There is mild hydroureter. IMPRESSION: 1. Bilateral nephrostogram confirms that both nephrostomy tubes are positioned within the  renal collecting systems. It appears that the ureters have regained patency slightly greater on the right than the left. Normal passage of urine down the ureter and into the bladder rather than out through the nephrostomy tubes likely explains the noted decreased nephrostomy tube output. 2. Patient currently altered and not cooperative. Therefore, further intervention was not performed. PLAN: Patient should return to interventional radiology on or about December 3rd for nephrostomy tube check and exchange. At that time, renal function should be assessed and patient may be a candidate for a physiologic capped trial if the ureters remain patent. Signed, Criselda Peaches, MD, [facility] Electronically Signed   By: Jacqulynn Cadet M.D.   On: 06/01/2019 13:18   Ir Nephrostogram Right Thru Existing Access  Result Date: 06/01/2019 INDICATION: 85 year old female with a history of pelvic mass causing bilateral ureteral obstruction. She had percutaneous nephrostomy tubes placed at an outside hospital on 04/11/2019. Recently, 1 of the tubes is concerned to have become partially displaced and output has decreased. She presents for nephrostomy tube injection and possible exchange if warranted. Patient is currently altered and not cooperative. EXAM: Bilateral nephrostomy tube injections COMPARISON:  Outside imaging dated 04/11/2019 MEDICATIONS: None. ANESTHESIA/SEDATION: None. CONTRAST:  20 mL Omnipaque 300-administered into the collecting system(s) FLUOROSCOPY TIME:  Fluoroscopy Time: 0 minutes 48 seconds (5 mGy). COMPLICATIONS: None immediate. PROCEDURE: Contrast injection was performed of the  existing left percutaneous nephrostomy tube. The tube is well positioned in the renal pelvis. Of note, there is no hydronephrosis and the ureter drains into the bladder. Attention was next turned to the existing right percutaneous nephrostomy tube. Contrast injection was performed. The tube is slightly pulled back into the lower pole infundibulum but remains within the renal collecting system. Again, the ureter appears patent at this time with contrast material draining into the bladder albeit slowly. There is mild hydroureter. IMPRESSION: 1. Bilateral nephrostogram confirms that both nephrostomy tubes are positioned within the renal collecting systems. It appears that the ureters have regained patency slightly greater on the right than the left. Normal passage of urine down the ureter and into the bladder rather than out through the nephrostomy tubes likely explains the noted decreased nephrostomy tube output. 2. Patient currently altered and not cooperative. Therefore, further intervention was not performed. PLAN: Patient should return to interventional radiology on or about December 3rd for nephrostomy tube check and exchange. At that time, renal function should be assessed and patient may be a candidate for a physiologic capped trial if the ureters remain patent. Signed, Criselda Peaches, MD, [facility] Electronically Signed   By: Jacqulynn Cadet M.D.   On: 06/01/2019 13:18   Vas [place] Upper Extremity Venous Duplex  Result Date: 06/02/2019 UPPER VENOUS STUDY  Indications: Swelling Risk Factors: None identified. Limitations: Line, bandages and patient movement. Comparison Study: No prior studies. Performing Technologist: Oliver Hum RVT  Examination Guidelines: A complete evaluation includes B-mode imaging, spectral Doppler, color Doppler, and power Doppler as needed of all accessible portions of each vessel. Bilateral testing is  considered an integral part of a complete examination. Limited examinations for reoccurring indications may be performed as noted.  Right Findings: +----------+------------+---------+-----------+----------+-------+  RIGHT      Compressible Phasicity Spontaneous Properties Summary  +----------+------------+---------+-----------+----------+-------+  Subclavian     Full        Yes        Yes                         +----------+------------+---------+-----------+----------+-------+  Left Findings: +----------+------------+---------+-----------+----------+-----------------+  LEFT       Compressible Phasicity Spontaneous Properties      Summary       +----------+------------+---------+-----------+----------+-----------------+  IJV            Full        Yes        Yes                                   +----------+------------+---------+-----------+----------+-----------------+  Subclavian     Full        Yes        Yes                                   +----------+------------+---------+-----------+----------+-----------------+  Axillary       Full        Yes        Yes                                   +----------+------------+---------+-----------+----------+-----------------+  Brachial       Full        Yes        Yes                                   +----------+------------+---------+-----------+----------+-----------------+  Radial         Full                                                         +----------+------------+---------+-----------+----------+-----------------+  Ulnar          Full                                                         +----------+------------+---------+-----------+----------+-----------------+  Cephalic       None                                      Age Indeterminate  +----------+------------+---------+-----------+----------+-----------------+  Basilic        Full                                                          +----------+------------+---------+-----------+----------+-----------------+  Summary:  Right: No evidence of thrombosis in the subclavian.  Left: No evidence of deep vein thrombosis in the upper extremity. Findings consistent with age indeterminate superficial vein thrombosis involving the left cephalic vein.  *See table(s) above for measurements and observations.    Preliminary         Scheduled Meds:  sodium chloride   Intravenous Once   sodium chloride   Intravenous Once  budesonide (PULMICORT) nebulizer solution  0.25 mg Nebulization BID   cholecalciferol  2,000 Units Oral Daily   feeding supplement (ENSURE ENLIVE)  237 mL Oral BID BM   ferrous sulfate  325 mg Oral BID WC   magnesium oxide  400 mg Oral Daily   multivitamin with minerals  1 tablet Oral Daily   traZODone  50 mg Oral QHS   valACYclovir  500 mg Oral Daily   Continuous Infusions:  ceFEPime (MAXIPIME) IV 2 g (06/02/19 1036)     LOS: 6 days    Time spent: 35 mins,More than 50% of that time was spent in counseling and/or coordination of care.      Shelly Coss, MD [facility] Pager [phone]  If 7PM-7AM, please contact night-coverage [email or web address] Password [facility] 06/02/2019, 12:48 PM

## 2019-06-02 NOTE — Progress Notes (Signed)
Pt continues to have RR in the 30's, oxygen sat is 94% on 4 liters [place]. Afebrile, responds to voice only.  Will continue to monitor.  MD notified.

## 2019-06-02 NOTE — Evaluation (Signed)
SLP Cancellation Note  Patient Details Name: Samantha Mata MRN: [id number] DOB: 1931/11/09   Cancelled treatment:       Reason Eval/Treat Not Completed: Other (comment)(pt not alert adequately for po intake)   Macario Golds 06/02/2019, 2:38 PM   Luanna Salk, [place] [facility] SLP [place] Pager [phone] Office [phone]

## 2019-06-03 LAB — CBC WITH DIFFERENTIAL/PLATELET
Abs Immature Granulocytes: 0.69 10*3/uL — ABNORMAL HIGH (ref 0.00–0.07)
Basophils Absolute: 0.2 10*3/uL — ABNORMAL HIGH (ref 0.0–0.1)
Basophils Relative: 0 %
Eosinophils Absolute: 0.1 10*3/uL (ref 0.0–0.5)
Eosinophils Relative: 0 %
HCT: 37.8 % (ref 36.0–46.0)
Hemoglobin: 11.5 g/dL — ABNORMAL LOW (ref 12.0–15.0)
Immature Granulocytes: 1 %
Lymphocytes Relative: 89 %
Lymphs Abs: 77.4 10*3/uL — ABNORMAL HIGH (ref 0.7–4.0)
MCH: 31.3 pg (ref 26.0–34.0)
MCHC: 30.4 g/dL (ref 30.0–36.0)
MCV: 103 fL — ABNORMAL HIGH (ref 80.0–100.0)
Monocytes Absolute: 6.3 10*3/uL — ABNORMAL HIGH (ref 0.1–1.0)
Monocytes Relative: 7 %
Neutro Abs: 2.2 10*3/uL (ref 1.7–7.7)
Neutrophils Relative %: 3 %
Platelets: 24 10*3/uL — CL (ref 150–400)
RBC: 3.67 MIL/uL — ABNORMAL LOW (ref 3.87–5.11)
RDW: 20 % — ABNORMAL HIGH (ref 11.5–15.5)
WBC Morphology: ABNORMAL
WBC: 86.9 10*3/uL (ref 4.0–10.5)
nRBC: 0.1 % (ref 0.0–0.2)

## 2019-06-03 LAB — URINE CULTURE: Culture: 40000 — AB

## 2019-06-03 LAB — BASIC METABOLIC PANEL
Anion gap: 7 (ref 5–15)
BUN: 24 mg/dL — ABNORMAL HIGH (ref 8–23)
CO2: 21 mmol/L — ABNORMAL LOW (ref 22–32)
Calcium: 8.6 mg/dL — ABNORMAL LOW (ref 8.9–10.3)
Chloride: 120 mmol/L — ABNORMAL HIGH (ref 98–111)
Creatinine, Ser: 0.79 mg/dL (ref 0.44–1.00)
GFR calc Af Amer: >60 mL/min (ref >60)
GFR calc non Af Amer: >60 mL/min (ref >60)
Glucose, Bld: 88 mg/dL (ref 70–99)
Potassium: 3.3 mmol/L — ABNORMAL LOW (ref 3.5–5.1)
Sodium: 148 mmol/L — ABNORMAL HIGH (ref 135–145)

## 2019-06-03 MED ORDER — SODIUM CHLORIDE 0.45 % IV SOLN
INTRAVENOUS | Status: DC
  Administered 2019-06-03: 09:00:00 via INTRAVENOUS

## 2019-06-03 NOTE — Progress Notes (Signed)
Report called to [facility]. Stacey Drain

## 2019-06-03 NOTE — Progress Notes (Signed)
CRITICAL VALUE STICKER  CRITICAL VALUE: Platelets 24  DATE & TIME NOTIFIED: 06/03/2019  [id number]  MD NOTIFIED: Hal Hope, MD  TIME OF [id number]  RESPONSE: Awaiting new orders

## 2019-06-03 NOTE — TOC Transition Note (Signed)
Transition of Care [facility]) - CM/SW Discharge Note   Patient Details  Name: Samantha Mata MRN: [id number] Date of Birth: 02-01-1932  [facility] CM/SW Contact:  Wende Neighbors, LCSW Phone Number: 06/03/2019, 2:35 PM   Clinical Narrative:   [facility] to [facility] via [place]. RN to call  [facility] for report. Son Ludwig Clarks made aware of discharge to hospice     Final next level of care: [place] Barriers to Discharge: No Barriers Identified   Patient Goals and CMS Choice        Discharge Placement              [facility] Patient to be transferred to facility by: ptar Name of family member notified: son Patient and family notified of of transfer: 06/03/19  Discharge Plan and Services   Discharge Planning Services: CM Consult Post Acute Care Choice: [place]                               Social Determinants of Health (SDOH) Interventions     Readmission Risk Interventions No flowsheet data found.

## 2019-06-03 NOTE — Consult Note (Signed)
[facility]--Pt has been approved for transfer to [place] [facility] today.  Son Ludwig Clarks will arrive to [place] at 1:30pm today to sign consent forms.  Pt ambulance transport to be set up for 2pm.  RN can call report to [place] at [place].  Thank you for the opportunity to serve pt and family during this difficult time.  Wynetta Fines, RN

## 2019-06-03 NOTE — Progress Notes (Signed)
Daily Progress Note   Patient Name: Samantha Mata       Date: 06/03/2019 DOB: [birth year]  Age: 85 y.o. MRN#: [id number] Attending Physician: Shelly Coss, MD Primary Care Physician: Patient, No Pcp Per Admit Date: 05/27/2019  Reason for Consultation/Follow-up: Establishing goals of care  Subjective:  patient's eyes are open but she is not alert. She moans periodically but doesn't appear to have non verbal gestures of distress or discomfort  Discussed with bedside RN Also discussed with son Ludwig Clarks on the phone this morning regarding disposition see below Also discussed with TRH MD this am   Length of Stay: 7  Current Medications: Scheduled Meds:  . sodium chloride   Intravenous Once  . sodium chloride   Intravenous Once  . budesonide (PULMICORT) nebulizer solution  0.25 mg Nebulization BID  . cholecalciferol  2,000 Units Oral Daily  . feeding supplement (ENSURE ENLIVE)  237 mL Oral BID BM  . ferrous sulfate  325 mg Oral BID WC  . magnesium oxide  400 mg Oral Daily  . multivitamin with minerals  1 tablet Oral Daily  . traZODone  50 mg Oral QHS  . valACYclovir  500 mg Oral Daily    Continuous Infusions: . sodium chloride 75 mL/hr at 06/03/19 0857   Medications noted.  PRN Meds: acetaminophen (TYLENOL) oral liquid 160 mg/5 mL, acetaminophen **OR** acetaminophen, ondansetron **OR** ondansetron (ZOFRAN) IV, polyethylene glycol  Physical Exam         Appears weak and chronically ill Decreased breath sounds Thin with muscle wasting evident Has colostomy No edema Not awake, not alert  Vital Signs: BP 119/77 (BP Location: Right Arm)   Pulse (!) 104   Temp (!) 101.3 F (38.5 C) (Rectal) Comment: pt was given tylenol suppository  Resp (!) 24   Ht 5\' 6"  (1.676 m)   Wt 51.5  kg   SpO2 97%   BMI 18.33 kg/m  SpO2: SpO2: 97 % O2 Device: O2 Device: Nasal Cannula O2 Flow Rate: O2 Flow Rate (L/min): 2 L/min  Intake/output summary:   Intake/Output Summary (Last 24 hours) at 06/03/2019 0901 Last data filed at 06/03/2019 [id number] Gross per 24 hour  Intake 200 ml  Output 885 ml  Net -685 ml   LBM: Last BM Date: 06/01/19 Baseline Weight: Weight: 40.8 kg Most recent weight: Weight:  51.5 kg       Palliative Assessment/Data:    Flowsheet Rows     Most Recent Value  Intake Tab  Referral Department  Hospitalist  Unit at Time of Referral  Med/Surg Unit  Palliative Care Primary Diagnosis  Cancer  Date Notified  05/28/19  Clinical Assessment  Psychosocial & Spiritual Assessment  Palliative Care Outcomes      Patient Active Problem List   Diagnosis Date Noted  . Palliative care by specialist   . Goals of care, counseling/discussion   . Protein-calorie malnutrition, severe 05/28/2019  . Pressure injury of skin 05/28/2019  . Acute lower UTI 05/27/2019  . Sepsis ([place]) 05/27/2019  . Hydronephrosis with ureteropelvic junction (UPJ) obstruction 04/20/2019  . Febrile illness, acute 04/02/2019  . Chronic lymphocytic leukemia (CLL), B-cell ([place]) 12/20/2014  . Colostomy status ([place]) 12/20/2014    Palliative Care Assessment & Plan   Patient Profile:    Assessment:  85 yo lady with suspected sepsis< CLL, hydronephrosis, dementia, pleural effusion. Ongoing decline PMT following.   Recommendations/Plan:  Call received and discussed with son Ludwig Clarks. He has discussed with other family members and the decision is to proceed with residential hospice in [place].   CSW consult placed  Continue current mode of care until hospice bed available and transfer arranged.    Goals of Care and Additional Recommendations:  Limitations on Scope of Treatment: transfer to hospice.   Code Status:    Code Status Orders  (From admission, onward)         Start      Ordered   05/27/19 1955  Do not attempt resuscitation (DNR)  Continuous    Question Answer Comment  In the event of cardiac or respiratory ARREST Do not call a "code blue"   In the event of cardiac or respiratory ARREST Do not perform Intubation, CPR, defibrillation or ACLS   In the event of cardiac or respiratory ARREST Use medication by any route, position, wound care, and other measures to relive pain and suffering. May use oxygen, suction and manual treatment of airway obstruction as needed for comfort.      05/27/19 1955        Code Status History    This patient has a current code status but no historical code status.   Advance Care Planning Activity       Prognosis:   < 2 weeks  Discharge Planning:  Hospice home.   Care plan was discussed with  Patient's son Ludwig Clarks on the phone.   Thank you for allowing the Palliative Medicine Team to assist in the care of this patient.   Time In: 8 Time Out: 8.35 Total Time 35 Prolonged Time Billed  no       Greater than 50%  of this time was spent counseling and coordinating care related to the above assessment and plan.  Loistine Chance, MD  Please contact Palliative Medicine Team phone at [phone] for questions and concerns.

## 2019-06-03 NOTE — Plan of Care (Signed)
  Problem: Health Behavior/Discharge Planning: Goal: Ability to manage health-related needs will improve Outcome: Not Progressing   Problem: Clinical Measurements: Goal: Respiratory complications will improve Outcome: Not Progressing   Problem: Activity: Goal: Risk for activity intolerance will decrease Outcome: Not Progressing   

## 2019-06-03 NOTE — TOC Progression Note (Signed)
Transition of Care [facility]) - Progression Note    Patient Details  Name: Samantha Mata MRN: [id number] Date of Birth: Nov 01, 1931  Transition of Care [facility]) CM/SW [place], LCSW Phone Number: 06/03/2019, 11:01 AM  Clinical Narrative:   CSW received consult to assist family in placing patient into a residential hospice facility. Family wanted [facility]. CSW contacted facility and made a verbal referral. Facility stated that they will reach out to son and go over the process with him. Hospice stated they will reach back out to CSW and make her aware if they are able to take her today     Expected Discharge Plan: [place] Barriers to Discharge: Continued Medical Work up  Expected Discharge Plan and Services Expected Discharge Plan: [place]   Discharge Planning Services: CM Consult Post Acute Care Choice: [place]   Expected Discharge Date: 06/03/19                                     Social Determinants of Health (SDOH) Interventions    Readmission Risk Interventions No flowsheet data found.

## 2019-06-03 NOTE — Care Management Important Message (Signed)
Important Message  Patient Details IM Lletter given to Rhea Pink SW to present to the Patient Name: Samantha Mata MRN: [id number] Date of Birth: 30-Jan-1932   Medicare Important Message Given:  Yes     Kerin Salen 06/03/2019, 11:45 AM

## 2019-06-03 NOTE — Discharge Summary (Signed)
Physician Discharge Summary  Samantha Mata [id number] DOB: 02/12/1932 DOA: 05/27/2019  PCP: Patient, No Pcp Per  Admit date: 05/27/2019 Discharge date: 06/03/2019  Admitted From: Home Disposition:  Home  Discharge Condition:Stable CODE STATUS:  Comfort Care Diet recommendation:Regular   Brief/Interim Summary:  Patient is 85 year old female with history of small B-cell lymphoma, hyperlipidemia, depression, dementia who was brought to the emergency department for the evaluation of generalized weakness.  She is from a skilled nursing facility.  Patient was unable to contribute anything to the history.  As per the report, she is currently getting treatment for her CLL at [facility].  Patient has a colostomy and bilateral nephrostomy tubes .  She has history of pelvic mass.  When she presented she was found to be hypotensive, febrile, tachycardic.  Sepsis was suspected.  UA was impressive for urinary tract infection.  Patient was started on broad-spectrum antibiotics. Palliative care also consulted for goals of care discussion and possible hospice consideration.   We are discussing with family for transitioning her care to residential hospice.She is not stable for discharge to skilled nursing facility. IR  Exchanged her bilateral nephrostomy tube because they were almost dislodged.  Chest x-ray showed bilateral moderate to large pleural effusion.  She underwent right sided thoracentesis. After several days of discussion with family, finally they have agreed on residential hospice.  She can be transferred to residential hospice as soon as bed is available.  Following problems were addressed during her  hospitalization:   Suspected sepsis present on admission: Afebrile today.  Hemodynamically stable source of sepsis is unclear but could be urine. Lactic acid normal.  Covid test 19 test negative.  Chest x-ray showed  pleural effusion bilaterally but no pneumonia. Urine culture  showed pansensitive ACINETOBACTER LWOFFII  And  KOCURIA SPECIES.Now last urine cultures showing some Enterobacter faecalis( susceptibility pending). She was on  cefepime. Blood cultures have not shown any growth.  CLL/leukocytosis: Severe leukocytosis.  Follows with oncology at [facility] with Dr Humphrey Rolls.  Chemotherapy was stopped because she was found to be resistant.  She has history of colon cancer dx in 1992 and underwent colostomy for bowel obstruction in 1997.  Severe thrombocytopenia: Due to CLL.S/p transfusion with  2 units  of platelets  Hydronephrosis with ureteropelvic junction obstruction: Has history of pelvic mass.  She is status post percutaneous nephrostomy.  Right-sided nephrostomy tube is almost dislodged and there is less urine output.Underwent  nephrostomy tube exchange. Patient is also status post colostomy. CT abdomen/pelvis done on October 2020 showed Large tumor mass within pelvis 11.6 x 11.7 x 12.3 cm in size , bilateral distal ureteral obstruction with hydroureteronephrosis.  Findings were consistent with leukemia or lymphoma.  Renal insufficiency: Currently kidney function at baseline.  BUN of 40 on presentation.Improved with IV fluids.    Altered mental status/ dementia: Patient is currently confused.   Current confusion could be due to acute metabolic encephalopathy from sepsis and other comorbidities.  As per the son, she does not have a clear diagnosis of dementia but she is increasingly confused since last few weeks.CT head did not show any acute intracranial abnormalities.  Bilateral moderate to severe pleural effusion: Currently requiring 4 L of oxygen per minute.  Chest imaging did not show pneumonia.Underwent  right-sided thoracentesis for malignant pleural effusion.  Macrocytic anemia: Normal iron, vitamin [id number], folic acid.  Hb dropped to the range of 6 and she was transfused with 2 units of PRBC.   Debility/deconditioning/multiple comorbidities/advanced  age:  Palliative care following.  Patient is DNR.  She is from assisted living facility.    We discussed with family about transitioning her care to residential hospice because she is very debilitated, has malignancy, advanced age, transfusion dependent.    Discharge Diagnoses:  Principal Problem:   Febrile illness, acute Active Problems:   Chronic lymphocytic leukemia (CLL), B-cell (HCC)   Colostomy status (HCC)   Hydronephrosis with ureteropelvic junction (UPJ) obstruction   Acute lower UTI   Sepsis (HCC)   Protein-calorie malnutrition, severe   Pressure injury of skin   Palliative care by specialist   Goals of care, counseling/discussion    Discharge Instructions  Discharge Instructions    Diet - low sodium heart healthy   Complete by: As directed    Discharge instructions   Complete by: As directed    Follow up with residential hospice     Allergies as of 06/03/2019      Reactions   Neosporin [bacitracin-polymyxin B]       Medication List    STOP taking these medications   acetaminophen 500 MG tablet Commonly known as: TYLENOL   albuterol 108 (90 Base) MCG/ACT inhaler Commonly known as: VENTOLIN HFA   allopurinol 300 MG tablet Commonly known as: ZYLOPRIM   Arnuity Ellipta 100 MCG/ACT Aepb Generic drug: Fluticasone Furoate   atorvastatin 10 MG tablet Commonly known as: LIPITOR   cholecalciferol 25 MCG (1000 UT) tablet Commonly known as: VITAMIN D   gabapentin 300 MG capsule Commonly known as: NEURONTIN   Integra F 125-1 MG Caps   magnesium oxide 400 MG tablet Commonly known as: MAG-OX   multivitamin tablet   ondansetron 4 MG tablet Commonly known as: ZOFRAN   potassium chloride 10 MEQ tablet Commonly known as: KLOR-CON   sertraline 100 MG tablet Commonly known as: ZOLOFT   traZODone 50 MG tablet Commonly known as: DESYREL   valACYclovir 500 MG tablet Commonly known as: VALTREX       Allergies  Allergen Reactions  . Neosporin  [Bacitracin-Polymyxin B]     Consultations:  Palliative care   Procedures/Studies: Dg Chest 1 View  Result Date: 06/02/2019 CLINICAL DATA:  Status post right-sided thoracentesis. EXAM: CHEST  1 VIEW COMPARISON:  Chest x-ray from yesterday. FINDINGS: Unchanged left chest wall port catheter. The heart size and mediastinal contours are within normal limits. Essentially resolved right pleural effusion status post thoracentesis. No pneumothorax. Unchanged small left pleural effusion with adjacent left basilar atelectasis. No acute osseous abnormality. IMPRESSION: 1. Essentially resolved right pleural effusion status post thoracentesis. No pneumothorax. 2. Unchanged small left pleural effusion with adjacent atelectasis. Electronically Signed   By: Titus Dubin M.D.   On: 06/02/2019 15:59   Dg Chest 1 View  Result Date: 06/01/2019 CLINICAL DATA:  Shortness of breath. EXAM: CHEST  1 VIEW COMPARISON:  May 31, 2019 FINDINGS: The heart size remains enlarged. The left-sided Port-A-Cath is unchanged in positioning. There are persistent moderate to large bilateral pleural effusions with adjacent airspace disease favored to represent atelectasis. There is no pneumothorax. No acute osseous abnormality. IMPRESSION: No significant interval change. Persistent bilateral pleural effusions with adjacent atelectasis. Electronically Signed   By: Constance Holster M.D.   On: 06/01/2019 18:27   Dg Chest 1 View  Result Date: 05/31/2019 CLINICAL DATA:  Shortness of breath EXAM: CHEST  1 VIEW COMPARISON:  05/27/2019 FINDINGS: Left-sided chest port terminates within the SVC. Stable cardiomediastinal contours. Persistent bilateral pleural effusions, right slightly greater than left with associated hazy bibasilar  opacities. No pneumothorax. IMPRESSION: No significant change in bilateral pleural effusions, right greater than left with associated hazy bibasilar opacities. Electronically Signed   By: Davina Poke  M.D.   On: 05/31/2019 13:30   Ct Head Wo Contrast  Result Date: 06/01/2019 CLINICAL DATA:  Encephalopathy EXAM: CT HEAD WITHOUT CONTRAST TECHNIQUE: Contiguous axial images were obtained from the base of the skull through the vertex without intravenous contrast. COMPARISON:  None. FINDINGS: Brain: There is no mass, hemorrhage or extra-axial collection. The size and configuration of the ventricles and extra-axial CSF spaces are normal. There is hypoattenuation of the white matter, most commonly indicating chronic small vessel disease. Vascular: No abnormal hyperdensity of the major intracranial arteries or dural venous sinuses. No intracranial atherosclerosis. Skull: The visualized skull base, calvarium and extracranial soft tissues are normal. Sinuses/Orbits: Complete opacification of the mastoid air cells. There is diffuse mild mucosal thickening of the paranasal sinuses. The orbits are normal. IMPRESSION: 1. No acute intracranial abnormality. 2. Chronic small vessel disease. 3. Complete opacification of the mastoid air cells and mild mucosal thickening of the paranasal sinuses. Electronically Signed   By: Ulyses Jarred M.D.   On: 06/01/2019 22:32   Dg Chest Port 1 View  Result Date: 05/27/2019 CLINICAL DATA:  Altered mental status. History of chronic lymphocytic leukemia. EXAM: PORTABLE CHEST 1 VIEW COMPARISON:  April 10, 2019. FINDINGS: Port-A-Cath tip is in the superior vena cava. No pneumothorax. There are pleural effusions bilaterally. There is no appreciable edema or consolidation. Heart size and pulmonary vascularity normal. No adenopathy. There is aortic atherosclerosis. Bones are osteoporotic.  No focal bone lesions are evident. IMPRESSION: Pleural effusions bilaterally without demonstrable edema or consolidation. Heart size normal. No adenopathy demonstrable by radiography. Port-A-Cath tip in superior vena cava. Bones osteoporotic. Aortic Atherosclerosis (ICD10-I70.0). Electronically Signed    By: Lowella Grip III M.D.   On: 05/27/2019 11:15   Ir Nephrostogram Left Thru Existing Access  Result Date: 06/01/2019 INDICATION: 85 year old female with a history of pelvic mass causing bilateral ureteral obstruction. She had percutaneous nephrostomy tubes placed at an outside hospital on 04/11/2019. Recently, 1 of the tubes is concerned to have become partially displaced and output has decreased. She presents for nephrostomy tube injection and possible exchange if warranted. Patient is currently altered and not cooperative. EXAM: Bilateral nephrostomy tube injections COMPARISON:  Outside imaging dated 04/11/2019 MEDICATIONS: None. ANESTHESIA/SEDATION: None. CONTRAST:  20 mL Omnipaque 300-administered into the collecting system(s) FLUOROSCOPY TIME:  Fluoroscopy Time: 0 minutes 48 seconds (5 mGy). COMPLICATIONS: None immediate. PROCEDURE: Contrast injection was performed of the existing left percutaneous nephrostomy tube. The tube is well positioned in the renal pelvis. Of note, there is no hydronephrosis and the ureter drains into the bladder. Attention was next turned to the existing right percutaneous nephrostomy tube. Contrast injection was performed. The tube is slightly pulled back into the lower pole infundibulum but remains within the renal collecting system. Again, the ureter appears patent at this time with contrast material draining into the bladder albeit slowly. There is mild hydroureter. IMPRESSION: 1. Bilateral nephrostogram confirms that both nephrostomy tubes are positioned within the renal collecting systems. It appears that the ureters have regained patency slightly greater on the right than the left. Normal passage of urine down the ureter and into the bladder rather than out through the nephrostomy tubes likely explains the noted decreased nephrostomy tube output. 2. Patient currently altered and not cooperative. Therefore, further intervention was not performed. PLAN: Patient should  return to interventional  radiology on or about December 3rd for nephrostomy tube check and exchange. At that time, renal function should be assessed and patient may be a candidate for a physiologic capped trial if the ureters remain patent. Signed, Criselda Peaches, MD, [facility] [place] [facility] Radiology Electronically Signed   By: Jacqulynn Cadet M.D.   On: 06/01/2019 13:18   Ir Nephrostogram Right Thru Existing Access  Result Date: 06/01/2019 INDICATION: 85 year old female with a history of pelvic mass causing bilateral ureteral obstruction. She had percutaneous nephrostomy tubes placed at an outside hospital on 04/11/2019. Recently, 1 of the tubes is concerned to have become partially displaced and output has decreased. She presents for nephrostomy tube injection and possible exchange if warranted. Patient is currently altered and not cooperative. EXAM: Bilateral nephrostomy tube injections COMPARISON:  Outside imaging dated 04/11/2019 MEDICATIONS: None. ANESTHESIA/SEDATION: None. CONTRAST:  20 mL Omnipaque 300-administered into the collecting system(s) FLUOROSCOPY TIME:  Fluoroscopy Time: 0 minutes 48 seconds (5 mGy). COMPLICATIONS: None immediate. PROCEDURE: Contrast injection was performed of the existing left percutaneous nephrostomy tube. The tube is well positioned in the renal pelvis. Of note, there is no hydronephrosis and the ureter drains into the bladder. Attention was next turned to the existing right percutaneous nephrostomy tube. Contrast injection was performed. The tube is slightly pulled back into the lower pole infundibulum but remains within the renal collecting system. Again, the ureter appears patent at this time with contrast material draining into the bladder albeit slowly. There is mild hydroureter. IMPRESSION: 1. Bilateral nephrostogram confirms that both nephrostomy tubes are positioned within the renal collecting systems. It appears that  the ureters have regained patency slightly greater on the right than the left. Normal passage of urine down the ureter and into the bladder rather than out through the nephrostomy tubes likely explains the noted decreased nephrostomy tube output. 2. Patient currently altered and not cooperative. Therefore, further intervention was not performed. PLAN: Patient should return to interventional radiology on or about December 3rd for nephrostomy tube check and exchange. At that time, renal function should be assessed and patient may be a candidate for a physiologic capped trial if the ureters remain patent. Signed, Criselda Peaches, MD, [place] [facility] Radiology Electronically Signed   By: Jacqulynn Cadet M.D.   On: 06/01/2019 13:18   Vas [place] Upper Extremity Venous Duplex  Result Date: 06/02/2019 UPPER VENOUS STUDY  Indications: Swelling Risk Factors: None identified. Limitations: Line, bandages and patient movement. Comparison Study: No prior studies. Performing Technologist: Oliver Hum RVT  Examination Guidelines: A complete evaluation includes B-mode imaging, spectral Doppler, color Doppler, and power Doppler as needed of all accessible portions of each vessel. Bilateral testing is considered an integral part of a complete examination. Limited examinations for reoccurring indications may be performed as noted.  Right Findings: +----------+------------+---------+-----------+----------+-------+ RIGHT     CompressiblePhasicitySpontaneousPropertiesSummary +----------+------------+---------+-----------+----------+-------+ Subclavian    Full       Yes       Yes                      +----------+------------+---------+-----------+----------+-------+  Left Findings: +----------+------------+---------+-----------+----------+-----------------+ LEFT      CompressiblePhasicitySpontaneousProperties     Summary       +----------+------------+---------+-----------+----------+-----------------+ IJV           Full       Yes       Yes                                +----------+------------+---------+-----------+----------+-----------------+  Subclavian    Full       Yes       Yes                                +----------+------------+---------+-----------+----------+-----------------+ Axillary      Full       Yes       Yes                                +----------+------------+---------+-----------+----------+-----------------+ Brachial      Full       Yes       Yes                                +----------+------------+---------+-----------+----------+-----------------+ Radial        Full                                                    +----------+------------+---------+-----------+----------+-----------------+ Ulnar         Full                                                    +----------+------------+---------+-----------+----------+-----------------+ Cephalic      None                                  Age Indeterminate +----------+------------+---------+-----------+----------+-----------------+ Basilic       Full                                                    +----------+------------+---------+-----------+----------+-----------------+  Summary:  Right: No evidence of thrombosis in the subclavian.  Left: No evidence of deep vein thrombosis in the upper extremity. Findings consistent with age indeterminate superficial vein thrombosis involving the left cephalic vein.  *See table(s) above for measurements and observations.  Diagnosing physician: Deitra Mayo MD Electronically signed by Deitra Mayo MD on 06/02/2019 at 4:41:05 PM.    Final    US Thoracentesis Asp Pleural Space W/img Guide  Result Date: 06/02/2019 INDICATION: Patient with history of lymphoma, dementia, bilateral hydronephrosis, pleural effusions. Request received for therapeutic  right thoracentesis. EXAM: ULTRASOUND GUIDED THERAPEUTIC RIGHT THORACENTESIS MEDICATIONS: None COMPLICATIONS: None immediate. PROCEDURE: An ultrasound guided thoracentesis was thoroughly discussed with the patient's son Janal Debarros and questions answered. The benefits, risks, alternatives and complications were also discussed. The patient understands and wishes to proceed with the procedure. Written consent was obtained. Ultrasound was performed to localize and mark an adequate pocket of fluid in the right chest. The area was then prepped and draped in the normal sterile fashion. 1% Lidocaine was used for local anesthesia. Under ultrasound guidance a 6 Fr Safe-T-Centesis catheter was introduced. Thoracentesis was performed. The catheter was removed and a dressing applied. FINDINGS: A total of approximately 500 cc of bloody fluid was removed. IMPRESSION:  Successful ultrasound guided therapeutic right thoracentesis yielding 500 cc of pleural fluid. Read by: Rowe Robert, PA-C Electronically Signed   By: Aletta Edouard M.D.   On: 06/02/2019 15:56       Subjective: Patient seen and examined at bedside this morning.  Hemodynamically stable but lethargic.  Not in acute distress.  Discharge Exam: Vitals:   06/03/19 0545 06/03/19 0913  BP:    Pulse:    Resp:    Temp: (!) 101.3 F (38.5 C)   SpO2:  95%   Vitals:   06/03/19 0500 06/03/19 0540 06/03/19 0545 06/03/19 0913  BP:  119/77    Pulse:  (!) 104    Resp:  (!) 24    Temp:  99.2 F (37.3 C) (!) 101.3 F (38.5 C)   TempSrc:  Axillary Rectal   SpO2:  97%  95%  Weight: 51.5 kg     Height:        General: Pt is not alert or  awake, not in acute distress Cardiovascular: RRR, S1/S2 +, no rubs, no gallops Respiratory: Decreased air entry on both sides Abdominal: Soft, NT, ND, bowel sounds +, colostomy Extremities: no edema, no cyanosis    The results of significant diagnostics from this hospitalization (including imaging, microbiology,  ancillary and laboratory) are listed below for reference.     Microbiology: Recent Results (from the past 240 hour(s))  Urine culture     Status: Abnormal   Collection Time: 05/27/19 10:50 AM   Specimen: In/Out Cath Urine  Result Value Ref Range Status   Specimen Description   Final    IN/OUT CATH URINE LT SIDE Performed at [facility] [address] [place], [place] [address]    Special Requests   Final    NONE Performed at [facility], [place] [address], [place], [place] [address]    Culture (A)  Final    >=100,000 COLONIES/mL ACINETOBACTER LWOFFII >=100,000 COLONIES/mL KOCURIA SPECIES Standardized susceptibility testing for this organism is not available. Performed at [facility], [place] [address] [place], [place] [address]    Report Status 05/29/2019 FINAL  Final   Organism ID, Bacteria ACINETOBACTER LWOFFII (A)  Final      Susceptibility   Acinetobacter lwoffii - MIC*    CEFTAZIDIME 8 SENSITIVE Sensitive     CEFTRIAXONE 16 INTERMEDIATE Intermediate     CIPROFLOXACIN <=0.25 SENSITIVE Sensitive     GENTAMICIN <=1 SENSITIVE Sensitive     IMIPENEM <=0.25 SENSITIVE Sensitive     PIP/TAZO 16 SENSITIVE Sensitive     TRIMETH/SULFA <=20 SENSITIVE Sensitive     CEFEPIME 2 SENSITIVE Sensitive     AMPICILLIN/SULBACTAM <=2 SENSITIVE Sensitive     * >=100,000 COLONIES/mL ACINETOBACTER LWOFFII  SARS CORONAVIRUS 2 (TAT 6-24 HRS) Nasopharyngeal Nasopharyngeal Swab     Status: None   Collection Time: 05/27/19 10:53 AM   Specimen: Nasopharyngeal Swab  Result Value Ref Range Status   SARS Coronavirus 2 NEGATIVE NEGATIVE Final    Comment: (NOTE) SARS-CoV-2 target nucleic acids are NOT DETECTED. The SARS-CoV-2 RNA is generally detectable in upper and lower respiratory specimens during the acute phase of infection. Negative results do not preclude SARS-CoV-2 infection, do not rule out co-infections with other pathogens, and should not be used  as the sole basis for treatment or other patient management decisions. Negative results must be combined with clinical observations, patient history, and epidemiological information. The expected result is Negative. Fact Sheet for Patients: SugarRoll.be Fact Sheet  for Healthcare Providers: https://www.woods-mathews.com/ This test is not yet approved or cleared by the [place] and  has been authorized for detection and/or diagnosis of SARS-CoV-2 by FDA under an Emergency Use Authorization (EUA). This EUA will remain  in effect (meaning this test can be used) for the duration of the COVID-19 declaration under Section 56 4(b)(1) of the Act, 21 U.S.C. section 360bbb-3(b)(1), unless the authorization is terminated or revoked sooner. Performed at [facility], [place] [address], [place], [place] [address]   Blood Culture (routine x 2)     Status: None   Collection Time: 05/27/19 10:55 AM   Specimen: BLOOD  Result Value Ref Range Status   Specimen Description   Final    BLOOD RIGHT ANTECUBITAL Performed at [facility] [address], [place], [place] [address]    Special Requests   Final    BOTTLES DRAWN AEROBIC AND ANAEROBIC Blood Culture adequate volume Performed at [facility] [address], [place], [place] [address]    Culture   Final    NO GROWTH 5 DAYS Performed at [facility], [place] [address] [place], [place] [address]    Report Status 06/01/2019 FINAL  Final  Blood Culture (routine x 2)     Status: None   Collection Time: 05/27/19 11:09 AM   Specimen: BLOOD  Result Value Ref Range Status   Specimen Description   Final    BLOOD LEFT ANTECUBITAL Performed at [facility] [address], [place], [place] [address]    Special Requests   Final    BOTTLES DRAWN AEROBIC AND ANAEROBIC Blood Culture adequate volume Performed at [facility] [address] [place], [place] [address]    Culture   Final    NO GROWTH 5 DAYS Performed at [facility], [place] [address], [place], [place] [address]    Report Status 06/01/2019 FINAL  Final  MRSA PCR Screening     Status: None   Collection Time: 05/28/19  5:00 AM   Specimen: Nasal Mucosa; Nasopharyngeal  Result Value Ref Range Status   MRSA by PCR NEGATIVE NEGATIVE Final    Comment:        The GeneXpert MRSA Assay (FDA approved for NASAL specimens only), is one component of a comprehensive MRSA colonization surveillance program. It is not intended to diagnose MRSA infection nor to guide or monitor treatment for MRSA infections. Performed at [facility], [place] [address] [place], [place] [address]   Culture, Urine     Status: Abnormal   Collection Time: 05/31/19 12:18 PM   Specimen: Urine, Random  Result Value Ref Range Status   Specimen Description   Final    URINE, RANDOM Performed at [place] [address], [place],  [address]    Special Requests   Final    Immunocompromised Performed at [facility], [place] [address], [place],  [address]    Culture (A)  Final    40,000 COLONIES/mL VANCOMYCIN RESISTANT ENTEROCOCCUS ISOLATED   Report Status 06/03/2019 FINAL  Final   Organism ID, Bacteria VANCOMYCIN RESISTANT ENTEROCOCCUS ISOLATED (A)  Final      Susceptibility   Vancomycin resistant enterococcus isolated - MIC*    AMPICILLIN <=2 SENSITIVE Sensitive     LEVOFLOXACIN >=8 RESISTANT Resistant     NITROFURANTOIN <=16 SENSITIVE Sensitive     VANCOMYCIN >=32 RESISTANT Resistant  LINEZOLID 2 SENSITIVE Sensitive     * 40,000 COLONIES/mL VANCOMYCIN RESISTANT ENTEROCOCCUS ISOLATED     Labs: BNP (last 3 results) No results for input(s): BNP in the last 8760 hours. Basic Metabolic Panel: Recent Labs  Lab 05/30/19 0528 05/31/19 0521 06/01/19 0513 06/02/19 0551 06/03/19 0521  NA 147*  145 147* 147* 148*  K 3.6 4.0 3.5 4.0 3.3*  CL 118* 115* 117* 118* 120*  CO2 22 22 22 22  21*  GLUCOSE 108* 104* 85 87 88  BUN 20 17 15 18  24*  CREATININE 0.77 0.71 0.63 0.66 0.79  CALCIUM 8.6* 8.6* 8.4* 8.5* 8.6*   Liver Function Tests: Recent Labs  Lab 05/27/19 1109 05/28/19 0530  AST 46* 36  ALT 23 19  ALKPHOS 84 64  BILITOT 0.6 0.8  PROT 6.4* 5.4*  ALBUMIN 3.2* 2.7*   No results for input(s): LIPASE, AMYLASE in the last 168 hours. No results for input(s): AMMONIA in the last 168 hours. CBC: Recent Labs  Lab 05/30/19 0528 05/31/19 0521 06/01/19 0713 06/02/19 0551 06/03/19 0521  WBC 90.2* 89.6* 73.3* 72.5* 86.9*  NEUTROABS 2.2 2.1 0.6* 0.8* 2.2  HGB 6.8* 10.7* 10.9* 10.7* 11.5*  HCT 23.8* 34.4* 34.2* 34.1* 37.8  MCV 108.2* 99.7 96.3 99.4 103.0*  PLT 18* 15* 42* 31* 24*   Cardiac Enzymes: No results for input(s): CKTOTAL, CKMB, CKMBINDEX, TROPONINI in the last 168 hours. BNP: Invalid input(s): POCBNP CBG: Recent Labs  Lab 06/01/19 1735 06/02/19 0147 06/02/19 0831  GLUCAP 73 76 73   D-Dimer No results for input(s): DDIMER in the last 72 hours. Hgb A1c No results for input(s): HGBA1C in the last 72 hours. Lipid Profile No results for input(s): CHOL, HDL, LDLCALC, TRIG, CHOLHDL, LDLDIRECT in the last 72 hours. Thyroid function studies No results for input(s): TSH, T4TOTAL, T3FREE, THYROIDAB in the last 72 hours.  Invalid input(s): FREET3 Anemia work up No results for input(s): VITAMINB12, FOLATE, FERRITIN, TIBC, IRON, RETICCTPCT in the last 72 hours. Urinalysis    Component Value Date/Time   COLORURINE YELLOW (A) 05/27/2019 1109   APPEARANCEUR CLOUDY (A) 05/27/2019 1109   LABSPEC 1.015 05/27/2019 1109   PHURINE 5.0 05/27/2019 1109   GLUCOSEU NEGATIVE 05/27/2019 1109   HGBUR LARGE (A) 05/27/2019 1109   BILIRUBINUR NEGATIVE 05/27/2019 1109   KETONESUR NEGATIVE 05/27/2019 1109   PROTEINUR 30 (A) 05/27/2019 1109   NITRITE NEGATIVE 05/27/2019 1109    LEUKOCYTESUR LARGE (A) 05/27/2019 1109   Sepsis Labs Invalid input(s): PROCALCITONIN,  WBC,  LACTICIDVEN Microbiology Recent Results (from the past 240 hour(s))  Urine culture     Status: Abnormal   Collection Time: 05/27/19 10:50 AM   Specimen: In/Out Cath Urine  Result Value Ref Range Status   Specimen Description   Final    IN/OUT CATH URINE LT SIDE Performed at [facility], [place] [address], [place], [place] [address]    Special Requests   Final    NONE Performed at [facility], [place] [address] [place], [place] [address]    Culture (A)  Final    >=100,000 COLONIES/mL ACINETOBACTER LWOFFII >=100,000 COLONIES/mL KOCURIA SPECIES Standardized susceptibility testing for this organism is not available. Performed at [facility], [place] [address], [place], [place] [address]    Report Status 05/29/2019 FINAL  Final   Organism ID, Bacteria ACINETOBACTER LWOFFII (A)  Final      Susceptibility   Acinetobacter lwoffii - MIC*    CEFTAZIDIME 8 SENSITIVE Sensitive  CEFTRIAXONE 16 INTERMEDIATE Intermediate     CIPROFLOXACIN <=0.25 SENSITIVE Sensitive     GENTAMICIN <=1 SENSITIVE Sensitive     IMIPENEM <=0.25 SENSITIVE Sensitive     PIP/TAZO 16 SENSITIVE Sensitive     TRIMETH/SULFA <=20 SENSITIVE Sensitive     CEFEPIME 2 SENSITIVE Sensitive     AMPICILLIN/SULBACTAM <=2 SENSITIVE Sensitive     * >=100,000 COLONIES/mL ACINETOBACTER LWOFFII  SARS CORONAVIRUS 2 (TAT 6-24 HRS) Nasopharyngeal Nasopharyngeal Swab     Status: None   Collection Time: 05/27/19 10:53 AM   Specimen: Nasopharyngeal Swab  Result Value Ref Range Status   SARS Coronavirus 2 NEGATIVE NEGATIVE Final    Comment: (NOTE) SARS-CoV-2 target nucleic acids are NOT DETECTED. The SARS-CoV-2 RNA is generally detectable in upper and lower respiratory specimens during the acute phase of infection. Negative results do not preclude SARS-CoV-2 infection, do not rule out co-infections  with other pathogens, and should not be used as the sole basis for treatment or other patient management decisions. Negative results must be combined with clinical observations, patient history, and epidemiological information. The expected result is Negative. Fact Sheet for Patients: SugarRoll.be Fact Sheet for Healthcare Providers: https://www.woods-mathews.com/ This test is not yet approved or cleared by the [place] FDA and  has been authorized for detection and/or diagnosis of SARS-CoV-2 by FDA under an Emergency Use Authorization (EUA). This EUA will remain  in effect (meaning this test can be used) for the duration of the COVID-19 declaration under Section 56 4(b)(1) of the Act, 21 U.S.C. section 360bbb-3(b)(1), unless the authorization is terminated or revoked sooner. Performed at [facility], [place] [address] [place], [place] [address]   Blood Culture (routine x 2)     Status: None   Collection Time: 05/27/19 10:55 AM   Specimen: BLOOD  Result Value Ref Range Status   Specimen Description   Final    BLOOD RIGHT ANTECUBITAL Performed at [facility] [address], [place], [place] [address]    Special Requests   Final    BOTTLES DRAWN AEROBIC AND ANAEROBIC Blood Culture adequate volume Performed at [facility] [address], [place], [place] [address]    Culture   Final    NO GROWTH 5 DAYS Performed at [facility], [place] [address] [place], [place] [address]    Report Status 06/01/2019 FINAL  Final  Blood Culture (routine x 2)     Status: None   Collection Time: 05/27/19 11:09 AM   Specimen: BLOOD  Result Value Ref Range Status   Specimen Description   Final    BLOOD LEFT ANTECUBITAL Performed at [facility] [address], [place], [place] [address]    Special Requests   Final    BOTTLES DRAWN AEROBIC AND ANAEROBIC Blood Culture adequate  volume Performed at [place] [address] [place], [place] [address]    Culture   Final    NO GROWTH 5 DAYS Performed at [facility], [place] [address] [place], [place] [address]    Report Status 06/01/2019 FINAL  Final  MRSA PCR Screening     Status: None   Collection Time: 05/28/19  5:00 AM   Specimen: Nasal Mucosa; Nasopharyngeal  Result Value Ref Range Status   MRSA by PCR NEGATIVE NEGATIVE Final    Comment:        The GeneXpert MRSA Assay (FDA approved for NASAL specimens only), is one component of a  comprehensive MRSA colonization surveillance program. It is not intended to diagnose MRSA infection nor to guide or monitor treatment for MRSA infections. Performed at [facility], [place] [address], [place], [place] [address]   Culture, Urine     Status: Abnormal   Collection Time: 05/31/19 12:18 PM   Specimen: Urine, Random  Result Value Ref Range Status   Specimen Description   Final    URINE, RANDOM Performed at [facility] [address], [place], [place] [address]    Special Requests   Final    Immunocompromised Performed at [facility], [place] [address], [place], [place] [address]    Culture (A)  Final    40,000 COLONIES/mL VANCOMYCIN RESISTANT ENTEROCOCCUS ISOLATED   Report Status 06/03/2019 FINAL  Final   Organism ID, Bacteria VANCOMYCIN RESISTANT ENTEROCOCCUS ISOLATED (A)  Final      Susceptibility   Vancomycin resistant enterococcus isolated - MIC*    AMPICILLIN <=2 SENSITIVE Sensitive     LEVOFLOXACIN >=8 RESISTANT Resistant     NITROFURANTOIN <=16 SENSITIVE Sensitive     VANCOMYCIN >=32 RESISTANT Resistant     LINEZOLID 2 SENSITIVE Sensitive     * 40,000 COLONIES/mL VANCOMYCIN RESISTANT ENTEROCOCCUS ISOLATED    Please note: You were cared for by a hospitalist during your hospital stay. Once you are discharged, your primary care physician will handle any further medical issues.  Please note that NO REFILLS for any discharge medications will be authorized once you are discharged, as it is imperative that you return to your primary care physician (or establish a relationship with a primary care physician if you do not have one) for your post hospital discharge needs so that they can reassess your need for medications and monitor your lab values.    Time coordinating discharge: 40 minutes  SIGNED:   Shelly Coss, MD  [facility] 06/03/2019, 10:15 AM Pager [phone]  If 7PM-7AM, please contact night-coverage [email or web address] Password [email or web address]

## 2019-06-09 DEATH — deceased

## 2020-04-04 IMAGING — US US THORACENTESIS ASP PLEURAL SPACE W/IMG GUIDE
1 series · 4 of 4 positions shown · non-contrast
Comparison: none

INDICATION: Patient with history of lymphoma, dementia, bilateral
hydronephrosis, pleural effusions. Request received for therapeutic
right thoracentesis.

[Series 1: us thoracentesis asp pleural space w/img guide · 4 of 4 slices shown]
[im 1/4]
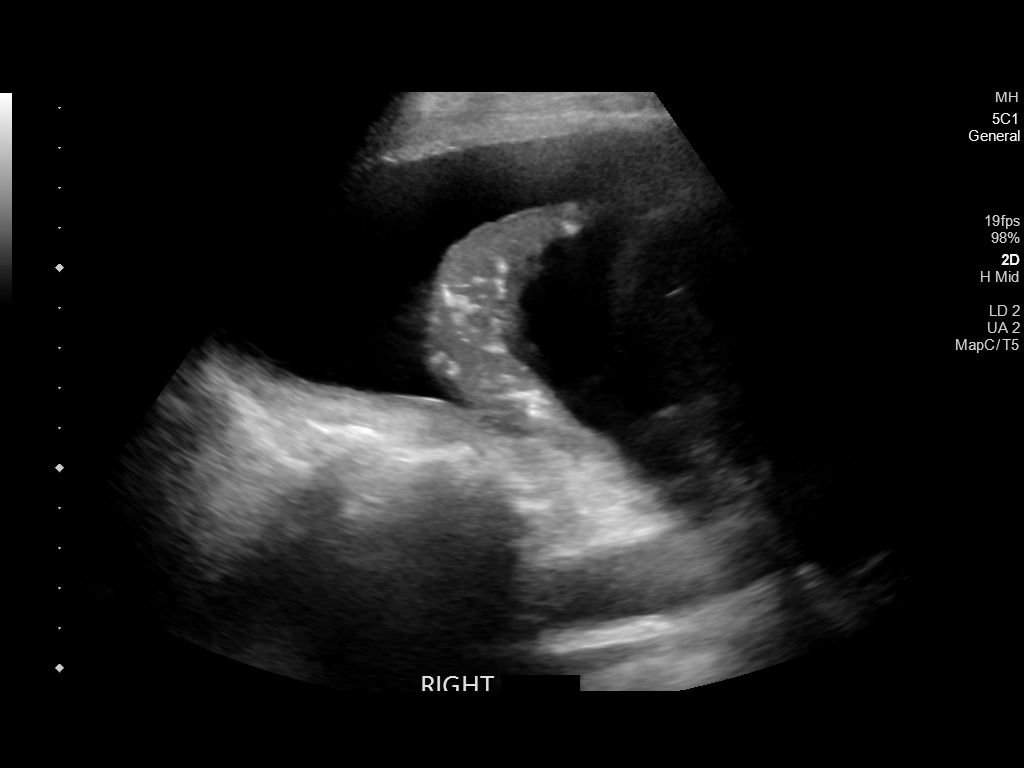
[im 2/4]
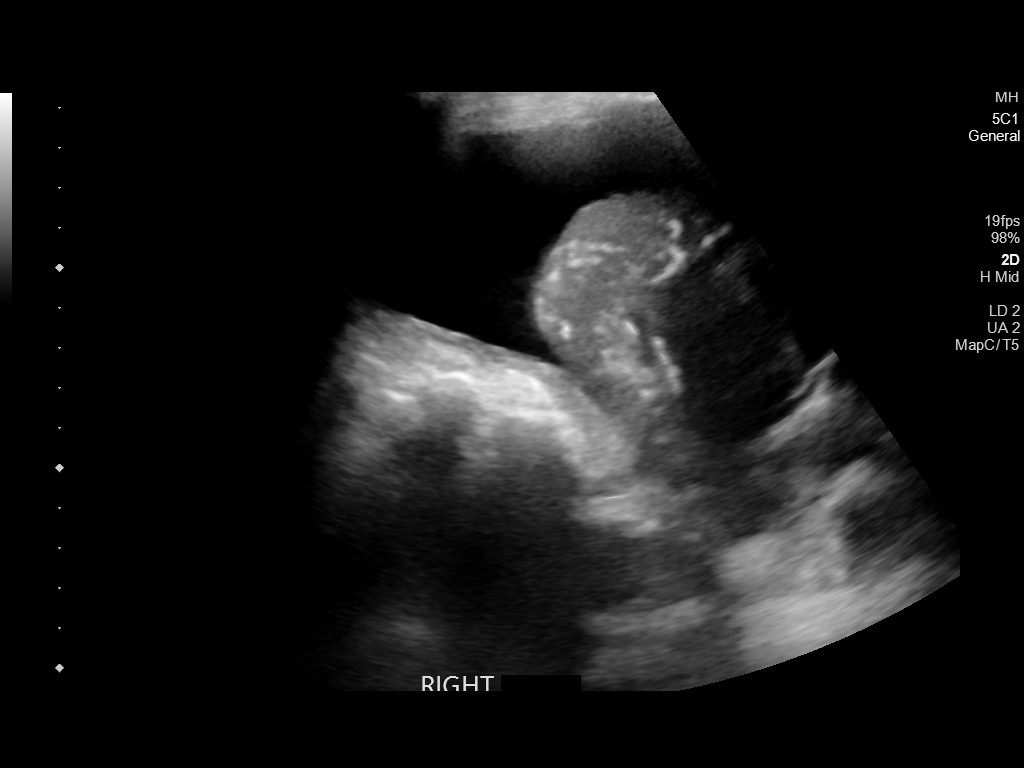
[im 3/4]
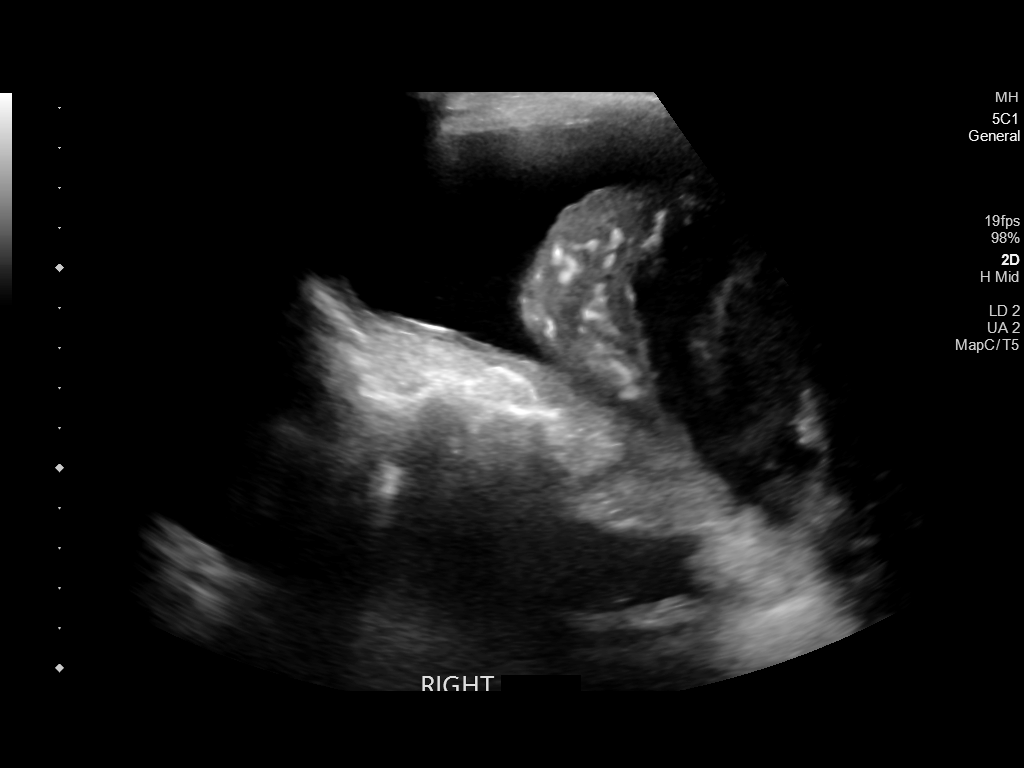
[im 4/4]
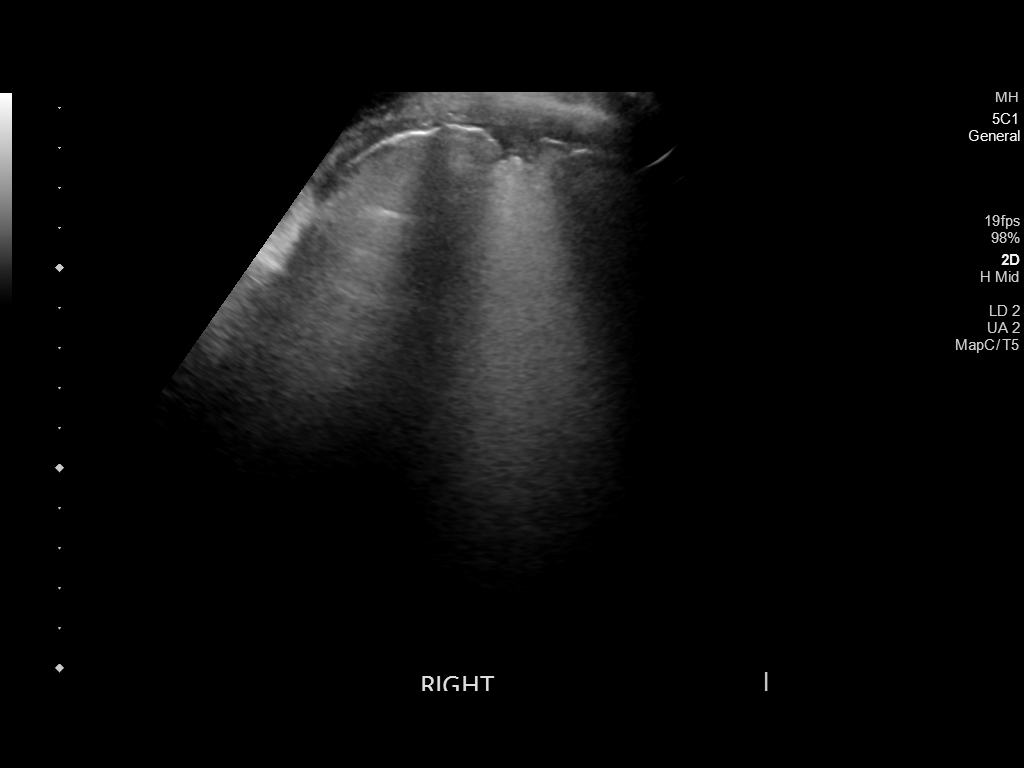

[4 of 4 positions shown; findings below may reference images not displayed]

EXAM:
ULTRASOUND GUIDED THERAPEUTIC RIGHT THORACENTESIS

MEDICATIONS:
None

COMPLICATIONS:
None immediate.

PROCEDURE:
An ultrasound guided thoracentesis was thoroughly discussed with the
patient's Grinys Holcmann and questions answered. The benefits,
risks, alternatives and complications were also discussed. The
patient understands and wishes to proceed with the procedure.
Written consent was obtained.

Ultrasound was performed to localize and mark an adequate pocket of
fluid in the right chest. The area was then prepped and draped in
the normal sterile fashion. 1% Lidocaine was used for local
anesthesia. Under ultrasound guidance a 6 Fr Safe-T-Centesis
catheter was introduced. Thoracentesis was performed. The catheter
was removed and a dressing applied.
FINDINGS: A total of approximately 500 cc of bloody fluid was removed.
IMPRESSION: Successful ultrasound guided therapeutic right thoracentesis
yielding 500 cc of pleural fluid.
